# Patient Record
Sex: Female | Born: 1970 | Race: White | Hispanic: No | Marital: Married | Smoking: Never smoker
Health system: Southern US, Community
[De-identification: ages and names within clinical notes are randomized; demographics above are authoritative.]

## PROBLEM LIST (undated history)

## (undated) DIAGNOSIS — E785 Hyperlipidemia, unspecified: Secondary | ICD-10-CM

## (undated) DIAGNOSIS — R011 Cardiac murmur, unspecified: Secondary | ICD-10-CM

## (undated) DIAGNOSIS — J45909 Unspecified asthma, uncomplicated: Secondary | ICD-10-CM

## (undated) DIAGNOSIS — D72829 Elevated white blood cell count, unspecified: Secondary | ICD-10-CM

## (undated) DIAGNOSIS — R32 Unspecified urinary incontinence: Secondary | ICD-10-CM

## (undated) DIAGNOSIS — N3281 Overactive bladder: Secondary | ICD-10-CM

## (undated) DIAGNOSIS — R7989 Other specified abnormal findings of blood chemistry: Secondary | ICD-10-CM

## (undated) HISTORY — DX: Other specified abnormal findings of blood chemistry: R79.89

## (undated) HISTORY — DX: Overactive bladder: N32.81

## (undated) HISTORY — DX: Unspecified urinary incontinence: R32

## (undated) HISTORY — DX: Cardiac murmur, unspecified: R01.1

## (undated) HISTORY — DX: Elevated white blood cell count, unspecified: D72.829

## (undated) HISTORY — DX: Unspecified asthma, uncomplicated: J45.909

## (undated) HISTORY — DX: Hyperlipidemia, unspecified: E78.5

---

## 2002-03-28 ENCOUNTER — Ambulatory Visit (HOSPITAL_COMMUNITY): Admission: RE | Admit: 2002-03-28 | Discharge: 2002-03-28 | Payer: Self-pay | Admitting: Family Medicine

## 2002-03-28 ENCOUNTER — Encounter: Payer: Self-pay | Admitting: Family Medicine

## 2003-06-16 HISTORY — PX: CHOLECYSTECTOMY: SHX55

## 2004-07-16 ENCOUNTER — Ambulatory Visit (HOSPITAL_COMMUNITY): Admission: RE | Admit: 2004-07-16 | Discharge: 2004-07-16 | Payer: Self-pay | Admitting: Family Medicine

## 2005-03-30 ENCOUNTER — Ambulatory Visit (HOSPITAL_COMMUNITY): Admission: RE | Admit: 2005-03-30 | Discharge: 2005-03-30 | Payer: Self-pay | Admitting: Family Medicine

## 2005-04-13 ENCOUNTER — Ambulatory Visit (HOSPITAL_COMMUNITY): Admission: RE | Admit: 2005-04-13 | Discharge: 2005-04-13 | Payer: Self-pay | Admitting: General Surgery

## 2005-04-13 ENCOUNTER — Encounter (INDEPENDENT_AMBULATORY_CARE_PROVIDER_SITE_OTHER): Payer: Self-pay | Admitting: General Surgery

## 2006-03-24 ENCOUNTER — Ambulatory Visit: Payer: Self-pay | Admitting: Gastroenterology

## 2006-03-31 ENCOUNTER — Ambulatory Visit: Payer: Self-pay | Admitting: Internal Medicine

## 2006-04-12 ENCOUNTER — Ambulatory Visit: Payer: Self-pay | Admitting: Gastroenterology

## 2006-04-12 ENCOUNTER — Encounter (INDEPENDENT_AMBULATORY_CARE_PROVIDER_SITE_OTHER): Payer: Self-pay | Admitting: *Deleted

## 2006-04-12 ENCOUNTER — Ambulatory Visit (HOSPITAL_COMMUNITY): Admission: RE | Admit: 2006-04-12 | Discharge: 2006-04-12 | Payer: Self-pay | Admitting: Gastroenterology

## 2006-05-10 ENCOUNTER — Ambulatory Visit: Payer: Self-pay | Admitting: Gastroenterology

## 2009-11-08 ENCOUNTER — Ambulatory Visit (HOSPITAL_COMMUNITY): Admission: RE | Admit: 2009-11-08 | Discharge: 2009-11-08 | Payer: Self-pay | Admitting: Internal Medicine

## 2009-11-18 ENCOUNTER — Ambulatory Visit (HOSPITAL_COMMUNITY): Admission: RE | Admit: 2009-11-18 | Discharge: 2009-11-18 | Payer: Self-pay | Admitting: Internal Medicine

## 2010-06-09 ENCOUNTER — Inpatient Hospital Stay (HOSPITAL_COMMUNITY)
Admission: AD | Admit: 2010-06-09 | Discharge: 2010-06-09 | Payer: Self-pay | Source: Home / Self Care | Attending: Obstetrics and Gynecology | Admitting: Obstetrics and Gynecology

## 2010-06-10 ENCOUNTER — Encounter (INDEPENDENT_AMBULATORY_CARE_PROVIDER_SITE_OTHER): Payer: Self-pay | Admitting: Obstetrics and Gynecology

## 2010-06-10 ENCOUNTER — Inpatient Hospital Stay (HOSPITAL_COMMUNITY)
Admission: RE | Admit: 2010-06-10 | Discharge: 2010-06-13 | Payer: Self-pay | Source: Home / Self Care | Attending: Obstetrics and Gynecology | Admitting: Obstetrics and Gynecology

## 2010-08-25 LAB — CBC
Hemoglobin: 12.1 g/dL (ref 12.0–15.0)
Hemoglobin: 8.7 g/dL — ABNORMAL LOW (ref 12.0–15.0)
MCH: 28.4 pg (ref 26.0–34.0)
MCV: 85.3 fL (ref 78.0–100.0)
MCV: 85.9 fL (ref 78.0–100.0)
Platelets: 206 10*3/uL (ref 150–400)
RDW: 17.3 % — ABNORMAL HIGH (ref 11.5–15.5)
WBC: 10.2 10*3/uL (ref 4.0–10.5)
WBC: 11.7 10*3/uL — ABNORMAL HIGH (ref 4.0–10.5)

## 2010-08-25 LAB — SURGICAL PCR SCREEN: MRSA, PCR: NEGATIVE

## 2010-08-25 LAB — URINALYSIS, ROUTINE W REFLEX MICROSCOPIC
Bilirubin Urine: NEGATIVE
Glucose, UA: NEGATIVE mg/dL
Nitrite: NEGATIVE
Urobilinogen, UA: 1 mg/dL (ref 0.0–1.0)
pH: 6 (ref 5.0–8.0)

## 2010-08-25 LAB — URINE MICROSCOPIC-ADD ON

## 2010-10-31 NOTE — H&P (Signed)
Julia Mitchell, Julia Mitchell                ACCOUNT NO.:  [id number]   MEDICAL RECORD NO.:  [id number]          PATIENT TYPE:  AMB   LOCATION:                                FACILITY:  [facility]   PHYSICIAN:  Dalia Heading, M.D.  DATE OF BIRTH:  09/03/1970   DATE OF ADMISSION:  04/13/2005  DATE OF DISCHARGE:  LH                                HISTORY & PHYSICAL   CHIEF COMPLAINT:  Chronic cholecystitis.   HISTORY OF PRESENT ILLNESS:  The patient is a 40 year old, white female who  is referred for evaluation and treatment of biliary colic secondary to  chronic cholecystitis.  She has been having right upper quadrant abdominal  pain with radiation to the right flank, nausea and bloating for several  weeks.  No fever, chills or jaundice have been noted.   PAST MEDICAL HISTORY:  Extrinsic allergies.   PAST SURGICAL HISTORY:  1.  Wisdom teeth.  2.  C-section.   CURRENT MEDICATIONS:  Allegra 1 tablet daily.   ALLERGIES:  PENICILLIN.   REVIEW OF SYSTEMS:  Noncontributory.   PHYSICAL EXAMINATION:  GENERAL:  The patient is a well-developed, well-  nourished, white female in no acute distress.  HEENT:  No scleral icterus.  LUNGS:  Clear to auscultation with equal breath sounds bilaterally.  HEART:  Regular rate and rhythm without S3, S4 and murmurs.  ABDOMEN:  Soft and nondistended.  She is tender in the right upper quadrant  to palpation.  No hepatosplenomegaly, masses or hernias are identified.   LABORATORY DATA AND X-RAY FINDINGS:  Ultrasound of the gallbladder reveals a  gallbladder polyp.   IMPRESSION:  Chronic cholecystitis.   PLAN:  The patient is scheduled for a laparoscopic cholecystectomy on  April 13, 2005.  The risks and benefits of the procedure including  bleeding, infection, hepatobiliary injury and the possibility of an open  procedure were fully explained to the patient gaining informed consent.      Dalia Heading, M.D.  Electronically Signed     MAJ/MEDQ  D:   04/09/2005  T:  04/09/2005  Job:  [id number]   cc:   Corrie Mckusick, M.D.  Fax: [phone]

## 2010-10-31 NOTE — Op Note (Signed)
Julia Mitchell, PREHN                ACCOUNT NO.:  [id number]   MEDICAL RECORD NO.:  [id number]          PATIENT TYPE:  AMB   LOCATION:  DAY                           FACILITY:  [facility]   PHYSICIAN:  Dalia Heading, M.D.  DATE OF BIRTH:  02-13-1971   DATE OF PROCEDURE:  04/13/2005  DATE OF DISCHARGE:                                 OPERATIVE REPORT   PREOPERATIVE DIAGNOSIS:  Chronic cholecystitis.   POSTOPERATIVE DIAGNOSIS:  Chronic cholecystitis.   PROCEDURE:  Laparoscopic cholecystectomy.   SURGEON:  Dr. Franky Macho.   ANESTHESIA:  General endotracheal.   INDICATIONS:  The patient is a 40 year old white female who is referred for  biliary colic secondary to chronic cholecystitis. Risks and benefits of the  procedure including bleeding, infection, hepatobiliary injury, and the  possibility of an open procedure were fully explained to the patient, who  gave informed consent.   PROCEDURE NOTE:  The patient was placed in supine position. After induction  of general endotracheal anesthesia, the abdomen was prepped and draped in  the usual sterile technique with Betadine. Surgical site confirmation was  performed.   An infraumbilical incision was made down to the fascia. Veress needle was  introduced into the abdominal cavity and confirmation of placement was done  using the saline drop test. The abdomen was then insufflated to 16 mmHg  pressure. An 11-mm trocar was introduced into the abdominal cavity under  direct visualization without difficulty. The patient was placed in reversed  Trendelenburg position. Additional 11-mm trocar was placed in the epigastric  region. 5-mm trocars were placed in the right upper quadrant and right flank  regions. Liver was inspected, noted to be within normal limits. The  gallbladder was retracted superior and laterally. The dissection was begun  around the infundibulum of the gallbladder. The cystic duct was first  identified its juncture to the  infundibulum fully identified. Endoclips were  placed proximally distally and the cystic duct and cystic duct was divided.  This was likewise done with the cystic artery. The gallbladder then freed  away from the gallbladder fossa using Bovie electrocautery. The gallbladder  delivered through the epigastric trocar site using EndoCatch bag. The  gallbladder bed was inspected, noted to within normal limits. All fluid and  air were then evacuated from the abdominal cavity prior to removal of the  trocars.   All wounds were irrigated with normal saline. All wounds were injected with  0.5 cm Sensorcaine. The infraumbilical fascia was reapproximated using a 0  Vicryl interrupted suture. All skin incisions were closed using staples.  Betadine ointment and dry sterile dressings were applied.   All tape and needle counts correct at the end of the procedure. The patient  was extubated in the operating room and went back to recovery room awake in  stable condition.   COMPLICATIONS:  None.   SPECIMEN:  Gallbladder.   BLOOD LOSS:  Minimal.      Dalia Heading, M.D.  Electronically Signed     MAJ/MEDQ  D:  04/13/2005  T:  04/13/2005  Job:  [id number]  cc:   Halford Chessman, M.D.  Fax: [phone]

## 2010-10-31 NOTE — Op Note (Signed)
[name], [name]                ACCOUNT NO.:  [id number]   MEDICAL RECORD NO.:  [id number]          PATIENT TYPE:  AMB   LOCATION:  DAY                           FACILITY:  [facility]   PHYSICIAN:  Kassie Mends, M.D.      DATE OF BIRTH:  07-Aug-1970   DATE OF PROCEDURE:  04/12/2006  DATE OF DISCHARGE:                                 OPERATIVE REPORT   PROCEDURE:  Esophagogastroduodenoscopy with cold forceps biopsy.   INDICATION FOR EXAM:  Julia Mitchell is a 40 year old female who presents with  persistent epigastric pain without weight loss.  She denies nausea, vomiting  or difficulty swallowing.   FINDINGS:  1. Diffuse antral erythema.  Biopsies obtained to evaluate for H pylori      infection or eosinophilic esophagitis.  2. Normal esophagus without evidence of Barrett's.  3. Normal duodenal bulb and second portion of the duodenum.   RECOMMENDATIONS:  1. Continue Aciphex once daily.  2. Will call with biopsy results.  3. Follow up in 3-4 weeks with Julia Mitchell.   MEDICATIONS:  1. Demerol 75 mg IV.  2. Versed 4 mg IV.   PROCEDURE TECHNIQUE:  Physical exam was performed.  Informed consent was  obtained from the patient after explaining the benefits, risks and  alternatives to the procedure.  The patient was connected to the monitor and  placed in left lateral position.  Continuous oxygen was provided by nasal  cannula and IV medicine administered through an indwelling cannula.  After  administration of sedation, the patient's esophagus was intubated and the  scope was passed under direct visualization to the second portion of the  duodenum.  The scope was subsequently removed slowly by carefully examining  color, texture, anatomy and integrity of mucosa on the way out.  The patient  was recovered in endoscopy suite and discharged to home in satisfactory  condition.      Kassie Mends, M.D.  Electronically Signed     SM/MEDQ  D:  04/13/2006  T:  04/14/2006  Job:  [id number]

## 2014-05-24 ENCOUNTER — Ambulatory Visit (INDEPENDENT_AMBULATORY_CARE_PROVIDER_SITE_OTHER): Payer: BC Managed Care – PPO | Admitting: Otolaryngology

## 2014-05-24 DIAGNOSIS — J0101 Acute recurrent maxillary sinusitis: Secondary | ICD-10-CM

## 2014-05-24 DIAGNOSIS — J310 Chronic rhinitis: Secondary | ICD-10-CM

## 2014-05-24 DIAGNOSIS — J343 Hypertrophy of nasal turbinates: Secondary | ICD-10-CM

## 2014-06-14 ENCOUNTER — Ambulatory Visit (INDEPENDENT_AMBULATORY_CARE_PROVIDER_SITE_OTHER): Payer: BC Managed Care – PPO | Admitting: Otolaryngology

## 2014-06-14 DIAGNOSIS — J342 Deviated nasal septum: Secondary | ICD-10-CM

## 2014-06-14 DIAGNOSIS — J0101 Acute recurrent maxillary sinusitis: Secondary | ICD-10-CM

## 2014-06-14 DIAGNOSIS — J343 Hypertrophy of nasal turbinates: Secondary | ICD-10-CM

## 2014-06-18 ENCOUNTER — Other Ambulatory Visit (INDEPENDENT_AMBULATORY_CARE_PROVIDER_SITE_OTHER): Payer: Self-pay | Admitting: Otolaryngology

## 2014-06-18 DIAGNOSIS — J321 Chronic frontal sinusitis: Secondary | ICD-10-CM

## 2014-06-20 ENCOUNTER — Ambulatory Visit (HOSPITAL_COMMUNITY)
Admission: RE | Admit: 2014-06-20 | Discharge: 2014-06-20 | Disposition: A | Discharge status: Home / Self Care | Payer: BLUE CROSS/BLUE SHIELD | Source: Ambulatory Visit | Attending: Otolaryngology | Admitting: Otolaryngology

## 2014-06-20 DIAGNOSIS — R51 Headache: Secondary | ICD-10-CM | POA: Diagnosis not present

## 2014-06-20 DIAGNOSIS — J321 Chronic frontal sinusitis: Secondary | ICD-10-CM | POA: Insufficient documentation

## 2014-06-28 ENCOUNTER — Ambulatory Visit (INDEPENDENT_AMBULATORY_CARE_PROVIDER_SITE_OTHER): Payer: BLUE CROSS/BLUE SHIELD | Admitting: Otolaryngology

## 2014-06-28 DIAGNOSIS — J310 Chronic rhinitis: Secondary | ICD-10-CM

## 2014-06-28 DIAGNOSIS — J322 Chronic ethmoidal sinusitis: Secondary | ICD-10-CM

## 2014-07-12 ENCOUNTER — Ambulatory Visit (INDEPENDENT_AMBULATORY_CARE_PROVIDER_SITE_OTHER): Payer: BLUE CROSS/BLUE SHIELD | Admitting: Otolaryngology

## 2014-07-12 DIAGNOSIS — J310 Chronic rhinitis: Secondary | ICD-10-CM

## 2014-11-02 ENCOUNTER — Encounter: Payer: Self-pay | Admitting: Obstetrics and Gynecology

## 2014-11-02 ENCOUNTER — Other Ambulatory Visit: Payer: Self-pay

## 2014-11-02 ENCOUNTER — Ambulatory Visit (INDEPENDENT_AMBULATORY_CARE_PROVIDER_SITE_OTHER): Payer: BLUE CROSS/BLUE SHIELD | Admitting: Obstetrics and Gynecology

## 2014-11-02 VITALS — BP 130/84 | HR 80 | Resp 18 | Ht 64.5 in | Wt 210.8 lb

## 2014-11-02 DIAGNOSIS — Z1231 Encounter for screening mammogram for malignant neoplasm of breast: Secondary | ICD-10-CM

## 2014-11-02 DIAGNOSIS — R32 Unspecified urinary incontinence: Secondary | ICD-10-CM | POA: Diagnosis not present

## 2014-11-02 DIAGNOSIS — Z0000 Encounter for general adult medical examination without abnormal findings: Secondary | ICD-10-CM | POA: Diagnosis not present

## 2014-11-02 DIAGNOSIS — Z01419 Encounter for gynecological examination (general) (routine) without abnormal findings: Secondary | ICD-10-CM

## 2014-11-02 LAB — POCT URINALYSIS DIPSTICK
BILIRUBIN UA: NEGATIVE
Blood, UA: NEGATIVE
Glucose, UA: NEGATIVE
KETONES UA: NEGATIVE
LEUKOCYTES UA: NEGATIVE
Nitrite, UA: NEGATIVE
PH UA: 5
Protein, UA: NEGATIVE
Urobilinogen, UA: NEGATIVE

## 2014-11-02 MED ORDER — NYSTATIN 100000 UNIT/GM EX POWD
Freq: Three times a day (TID) | CUTANEOUS | Status: DC
Start: 2014-11-02 — End: 2018-12-28

## 2014-11-02 NOTE — Patient Instructions (Signed)

## 2014-11-02 NOTE — Progress Notes (Signed)
Patient ID: Julia Mitchell, female   DOB: 1971/06/07, [age] y.o.   MRN: [id number] 44 y.o. 613P4 Married Caucasian female here for annual exam.    Menses every 4 - 8 weeks.  Can be heavy or light.  No prolonged bleeding episodes.  Some leakage with cough or sneeze.   Has poison ivy currently.   PCP:   Assunta FoundJohn Golding, MD  Patient's last menstrual period was 10/07/2014.          Sexually active: Yes.   female The current method of family planning is tubal ligation.    Exercising: No.  none. Smoker:  no  Health Maintenance: Pap:  2012 normal History of abnormal Pap:  no MMG:  NEVER Colonoscopy:  N/A BMD:   N/A  Result  N/A TDaP:  PCP Screening Labs:  Hb today: PCP, Urine today: Neg   reports that she has never smoked. She does not have any smokeless tobacco history on file. She reports that she does not drink alcohol or use illicit drugs.  Past Medical History  Diagnosis Date  . Heart murmur     functional--since childhood  . Urinary incontinence     occ. with coughing, sneezing    Past Surgical History  Procedure Laterality Date  . Cholecystectomy  2005  . Cesarean section      2002, 2011    Current Outpatient Prescriptions  Medication Sig Dispense Refill  . fluticasone (FLONASE) 50 MCG/ACT nasal spray Place 1 spray into both nostrils at bedtime.  98  . hydrocortisone cream 1 % Apply 1 application topically as needed for itching.    . loratadine (CLARITIN) 10 MG tablet Take 10 mg by mouth daily.    Marland Kitchen. triamcinolone cream (KENALOG) 0.1 % Apply 1 application topically 2 (two) times daily.  0   No current facility-administered medications for this visit.    Family History  Problem Relation Age of Onset  . COPD Father     Dec age 46  . Hypertension Mother   . Thyroid disease Mother   . Stroke Paternal Aunt   . Rheum arthritis Maternal Grandmother   . Asthma Maternal Grandfather   . Hypertension Maternal Grandfather     ROS:  Pertinent items are noted in HPI.   Otherwise, a comprehensive ROS was negative.  Exam:   BP 130/84 mmHg  Pulse 80  Resp 18  Ht 5' 4.5" (1.638 m)  Wt 210 lb 12.8 oz (95.618 kg)  BMI 35.64 kg/m2  LMP 10/07/2014    General appearance: alert, cooperative and appears stated age Head: Normocephalic, without obvious abnormality, atraumatic Neck: no adenopathy, supple, symmetrical, trachea midline and thyroid normal to inspection and palpation Lungs: clear to auscultation bilaterally Breasts: normal appearance, no masses or tenderness, Inspection negative, No nipple retraction or dimpling, No nipple discharge or bleeding, No axillary or supraclavicular adenopathy Heart: regular rate and rhythm Abdomen: Pfannensteil incision, soft, non-tender; bowel sounds normal; no masses,  no organomegaly Extremities: extremities normal, atraumatic, no cyanosis or edema Skin: Skin color, texture, turgor normal. No rashes or lesions Lymph nodes: Cervical, supraclavicular, and axillary nodes normal. No abnormal inguinal nodes palpated Neurologic: Grossly normal  Pelvic: External genitalia:  no lesions              Urethra:  normal appearing urethra with no masses, tenderness or lesions              Bartholins and Skenes: normal  Vagina: normal appearing vagina with normal color and discharge, no lesions              Cervix: no lesions              Pap taken: Yes.   Bimanual Exam:  Uterus:  normal size, contour, position, consistency, mobility, non-tender              Adnexa: normal adnexa and no mass, fullness, tenderness              Rectovaginal: Yes.  .  Confirms.              Anus:  normal sphincter tone, no lesions  Chaperone was present for exam.  Assessment:   Well woman visit with normal exam. Status post bilateral tubal ligation.  Mild genuine stress incontinence.  Plan: Yearly mammogram recommended after age 46.  [facility]  Recommended self breast exam.  Pap and HR HPV as  above. Discussed Calcium, Vitamin D, regular exercise program including cardiovascular and weight bearing exercise. Kegel's reviewed.  Labs performed.  No..    Refills given on medications.  No..    Follow up annually and prn.      After visit summary provided.

## 2014-11-06 LAB — IPS PAP TEST WITH HPV

## 2014-11-29 ENCOUNTER — Ambulatory Visit
Admission: RE | Admit: 2014-11-29 | Discharge: 2014-11-29 | Disposition: A | Discharge status: Home / Self Care | Payer: BLUE CROSS/BLUE SHIELD | Source: Ambulatory Visit

## 2014-11-29 DIAGNOSIS — Z1231 Encounter for screening mammogram for malignant neoplasm of breast: Secondary | ICD-10-CM

## 2015-05-29 ENCOUNTER — Ambulatory Visit (HOSPITAL_COMMUNITY)
Admission: RE | Admit: 2015-05-29 | Discharge: 2015-05-29 | Disposition: A | Discharge status: Home / Self Care | Payer: BLUE CROSS/BLUE SHIELD | Source: Ambulatory Visit | Attending: Family Medicine | Admitting: Family Medicine

## 2015-05-29 ENCOUNTER — Other Ambulatory Visit (HOSPITAL_COMMUNITY): Payer: Self-pay | Admitting: Family Medicine

## 2015-05-29 DIAGNOSIS — R05 Cough: Secondary | ICD-10-CM

## 2015-05-29 DIAGNOSIS — R0602 Shortness of breath: Secondary | ICD-10-CM | POA: Insufficient documentation

## 2015-05-29 DIAGNOSIS — R059 Cough, unspecified: Secondary | ICD-10-CM

## 2015-11-07 ENCOUNTER — Ambulatory Visit (INDEPENDENT_AMBULATORY_CARE_PROVIDER_SITE_OTHER): Payer: BLUE CROSS/BLUE SHIELD | Admitting: Obstetrics and Gynecology

## 2015-11-07 ENCOUNTER — Encounter: Payer: Self-pay | Admitting: Obstetrics and Gynecology

## 2015-11-07 VITALS — BP 140/82 | HR 70 | Resp 16 | Ht 64.25 in | Wt 203.8 lb

## 2015-11-07 DIAGNOSIS — Z0000 Encounter for general adult medical examination without abnormal findings: Secondary | ICD-10-CM

## 2015-11-07 DIAGNOSIS — Z01419 Encounter for gynecological examination (general) (routine) without abnormal findings: Secondary | ICD-10-CM

## 2015-11-07 LAB — POCT URINALYSIS DIPSTICK
BILIRUBIN UA: NEGATIVE
Blood, UA: NEGATIVE
Glucose, UA: NEGATIVE
KETONES UA: NEGATIVE
LEUKOCYTES UA: NEGATIVE
NITRITE UA: NEGATIVE
PH UA: 5
Protein, UA: NEGATIVE
Urobilinogen, UA: NEGATIVE

## 2015-11-07 NOTE — Patient Instructions (Signed)
Health Maintenance, Female Adopting a healthy lifestyle and getting preventive care can go a long way to promote health and wellness. Talk with your health care provider about what schedule of regular examinations is right for you. This is a good chance for you to check in with your provider about disease prevention and staying healthy. In between checkups, there are plenty of things you can do on your own. Experts have done a lot of research about which lifestyle changes and preventive measures are most likely to keep you healthy. Ask your health care provider for more information. WEIGHT AND DIET  Eat a healthy diet  Be sure to include plenty of vegetables, fruits, low-fat dairy products, and lean protein.  Do not eat a lot of foods high in solid fats, added sugars, or salt.  Get regular exercise. This is one of the most important things you can do for your health.  Most adults should exercise for at least 150 minutes each week. The exercise should increase your heart rate and make you sweat (moderate-intensity exercise).  Most adults should also do strengthening exercises at least twice a week. This is in addition to the moderate-intensity exercise.  Maintain a healthy weight  Body mass index (BMI) is a measurement that can be used to identify possible weight problems. It estimates body fat based on height and weight. Your health care provider can help determine your BMI and help you achieve or maintain a healthy weight.  For females 20 years of age and older:   A BMI below 18.5 is considered underweight.  A BMI of 18.5 to 24.9 is normal.  A BMI of 25 to 29.9 is considered overweight.  A BMI of 30 and above is considered obese.  Watch levels of cholesterol and blood lipids  You should start having your blood tested for lipids and cholesterol at 47 years of age, then have this test every 5 years.  You may need to have your cholesterol levels checked more often if:  Your lipid  or cholesterol levels are high.  You are older than 47 years of age.  You are at high risk for heart disease.  CANCER SCREENING   Lung Cancer  Lung cancer screening is recommended for adults 55-80 years old who are at high risk for lung cancer because of a history of smoking.  A yearly low-dose CT scan of the lungs is recommended for people who:  Currently smoke.  Have quit within the past 15 years.  Have at least a 30-pack-year history of smoking. A pack year is smoking an average of one pack of cigarettes a day for 1 year.  Yearly screening should continue until it has been 15 years since you quit.  Yearly screening should stop if you develop a health problem that would prevent you from having lung cancer treatment.  Breast Cancer  Practice breast self-awareness. This means understanding how your breasts normally appear and feel.  It also means doing regular breast self-exams. Let your health care provider know about any changes, no matter how small.  If you are in your 20s or 30s, you should have a clinical breast exam (CBE) by a health care provider every 1-3 years as part of a regular health exam.  If you are 40 or older, have a CBE every year. Also consider having a breast X-ray (mammogram) every year.  If you have a family history of breast cancer, talk to your health care provider about genetic screening.  If you   are at high risk for breast cancer, talk to your health care provider about having an MRI and a mammogram every year.  Breast cancer gene (BRCA) assessment is recommended for women who have family members with BRCA-related cancers. BRCA-related cancers include:  Breast.  Ovarian.  Tubal.  Peritoneal cancers.  Results of the assessment will determine the need for genetic counseling and BRCA1 and BRCA2 testing. Cervical Cancer Your health care provider may recommend that you be screened regularly for cancer of the pelvic organs (ovaries, uterus, and  vagina). This screening involves a pelvic examination, including checking for microscopic changes to the surface of your cervix (Pap test). You may be encouraged to have this screening done every 3 years, beginning at age 21.  For women ages 30-65, health care providers may recommend pelvic exams and Pap testing every 3 years, or they may recommend the Pap and pelvic exam, combined with testing for human papilloma virus (HPV), every 5 years. Some types of HPV increase your risk of cervical cancer. Testing for HPV may also be done on women of any age with unclear Pap test results.  Other health care providers may not recommend any screening for nonpregnant women who are considered low risk for pelvic cancer and who do not have symptoms. Ask your health care provider if a screening pelvic exam is right for you.  If you have had past treatment for cervical cancer or a condition that could lead to cancer, you need Pap tests and screening for cancer for at least 20 years after your treatment. If Pap tests have been discontinued, your risk factors (such as having a new sexual partner) need to be reassessed to determine if screening should resume. Some women have medical problems that increase the chance of getting cervical cancer. In these cases, your health care provider may recommend more frequent screening and Pap tests. Colorectal Cancer  This type of cancer can be detected and often prevented.  Routine colorectal cancer screening usually begins at 47 years of age and continues through 47 years of age.  Your health care provider may recommend screening at an earlier age if you have risk factors for colon cancer.  Your health care provider may also recommend using home test kits to check for hidden blood in the stool.  A small camera at the end of a tube can be used to examine your colon directly (sigmoidoscopy or colonoscopy). This is done to check for the earliest forms of colorectal  cancer.  Routine screening usually begins at age 50.  Direct examination of the colon should be repeated every 5-10 years through 47 years of age. However, you may need to be screened more often if early forms of precancerous polyps or small growths are found. Skin Cancer  Check your skin from head to toe regularly.  Tell your health care provider about any new moles or changes in moles, especially if there is a change in a mole's shape or color.  Also tell your health care provider if you have a mole that is larger than the size of a pencil eraser.  Always use sunscreen. Apply sunscreen liberally and repeatedly throughout the day.  Protect yourself by wearing long sleeves, pants, a wide-brimmed hat, and sunglasses whenever you are outside. HEART DISEASE, DIABETES, AND HIGH BLOOD PRESSURE   High blood pressure causes heart disease and increases the risk of stroke. High blood pressure is more likely to develop in:  People who have blood pressure in the high end   of the normal range (130-139/85-89 mm Hg).  People who are overweight or obese.  People who are African American.  If you are 38-23 years of age, have your blood pressure checked every 3-5 years. If you are 61 years of age or older, have your blood pressure checked every year. You should have your blood pressure measured twice--once when you are at a hospital or clinic, and once when you are not at a hospital or clinic. Record the average of the two measurements. To check your blood pressure when you are not at a hospital or clinic, you can use:  An automated blood pressure machine at a pharmacy.  A home blood pressure monitor.  If you are between 45 years and 39 years old, ask your health care provider if you should take aspirin to prevent strokes.  Have regular diabetes screenings. This involves taking a blood sample to check your fasting blood sugar level.  If you are at a normal weight and have a low risk for diabetes,  have this test once every three years after 47 years of age.  If you are overweight and have a high risk for diabetes, consider being tested at a younger age or more often. PREVENTING INFECTION  Hepatitis B  If you have a higher risk for hepatitis B, you should be screened for this virus. You are considered at high risk for hepatitis B if:  You were born in a country where hepatitis B is common. Ask your health care provider which countries are considered high risk.  Your parents were born in a high-risk country, and you have not been immunized against hepatitis B (hepatitis B vaccine).  You have HIV or AIDS.  You use needles to inject street drugs.  You live with someone who has hepatitis B.  You have had sex with someone who has hepatitis B.  You get hemodialysis treatment.  You take certain medicines for conditions, including cancer, organ transplantation, and autoimmune conditions. Hepatitis C  Blood testing is recommended for:  Everyone born from [birth year] through 1967.  Anyone with known risk factors for hepatitis C. Sexually transmitted infections (STIs)  You should be screened for sexually transmitted infections (STIs) including gonorrhea and chlamydia if:  You are sexually active and are younger than 47 years of age.  You are older than 47 years of age and your health care provider tells you that you are at risk for this type of infection.  Your sexual activity has changed since you were last screened and you are at an increased risk for chlamydia or gonorrhea. Ask your health care provider if you are at risk.  If you do not have HIV, but are at risk, it may be recommended that you take a prescription medicine daily to prevent HIV infection. This is called pre-exposure prophylaxis (PrEP). You are considered at risk if:  You are sexually active and do not regularly use condoms or know the HIV status of your partner(s).  You take drugs by injection.  You are sexually  active with a partner who has HIV. Talk with your health care provider about whether you are at high risk of being infected with HIV. If you choose to begin PrEP, you should first be tested for HIV. You should then be tested every 3 months for as long as you are taking PrEP.  PREGNANCY   If you are premenopausal and you may become pregnant, ask your health care provider about preconception counseling.  If you may  become pregnant, take 400 to 800 micrograms (mcg) of folic acid every day.  If you want to prevent pregnancy, talk to your health care provider about birth control (contraception). OSTEOPOROSIS AND MENOPAUSE   Osteoporosis is a disease in which the bones lose minerals and strength with aging. This can result in serious bone fractures. Your risk for osteoporosis can be identified using a bone density scan.  If you are 61 years of age or older, or if you are at risk for osteoporosis and fractures, ask your health care provider if you should be screened.  Ask your health care provider whether you should take a calcium or vitamin D supplement to lower your risk for osteoporosis.  Menopause may have certain physical symptoms and risks.  Hormone replacement therapy may reduce some of these symptoms and risks. Talk to your health care provider about whether hormone replacement therapy is right for you.  HOME CARE INSTRUCTIONS   Schedule regular health, dental, and eye exams.  Stay current with your immunizations.   Do not use any tobacco products including cigarettes, chewing tobacco, or electronic cigarettes.  If you are pregnant, do not drink alcohol.  If you are breastfeeding, limit how much and how often you drink alcohol.  Limit alcohol intake to no more than 1 drink per day for nonpregnant women. One drink equals 12 ounces of beer, 5 ounces of wine, or 1 ounces of hard liquor.  Do not use street drugs.  Do not share needles.  Ask your health care provider for help if  you need support or information about quitting drugs.  Tell your health care provider if you often feel depressed.  Tell your health care provider if you have ever been abused or do not feel safe at home.   This information is not intended to replace advice given to you by your health care provider. Make sure you discuss any questions you have with your health care provider.   Document Released: 12/15/2010 Document Revised: 06/22/2014 Document Reviewed: 05/03/2013 Elsevier Interactive Patient Education [employer].

## 2015-11-07 NOTE — Progress Notes (Signed)
Patient ID: Julia Mitchell, female   DOB: May 01, 1971, 45 y.o.   MRN: [id number] 45 y.o. G30P2104 Married Caucasian female here for annual exam.    Menses every other month. Some night swets. Managing ok.  New dx of asthma.   Taking Vesicare for urinary frequency.  Saw Dr. Nechama Guard, urology, in [place].  Little bit of leak with coughing.   Son graduating this June and going off to college.  PCP:  Assunta Found, MD   Patient's last menstrual period was 10/16/2015 (exact date).           Sexually active: Yes.   female The current method of family planning is tubal ligation.    Exercising: No.   Smoker:  no  Health Maintenance: Pap:  11-02-14 Neg:Neg HR HPV History of abnormal Pap:  no MMG:  11-29-14 3D/Density B/Neg/BiRads1:[facility]. Colonoscopy:  n/a BMD:   n/a  Result  n/a TDaP:  PCP Gardasil:   N/A Labs:  Hb today: PCP, Urine today: Neg   reports that she has never smoked. She does not have any smokeless tobacco history on file. She reports that she does not drink alcohol or use illicit drugs.  Past Medical History  Diagnosis Date  . Heart murmur     functional--since childhood  . Urinary incontinence     occ. with coughing, sneezing  . Asthma   . OAB (overactive bladder)     Past Surgical History  Procedure Laterality Date  . Cholecystectomy  2005  . Cesarean section      2002, 2011    Current Outpatient Prescriptions  Medication Sig Dispense Refill  . fluticasone (FLONASE) 50 MCG/ACT nasal spray Place 1 spray into both nostrils every morning.   98  . hydrocortisone cream 1 % Apply 1 application topically as needed for itching.    . loratadine (CLARITIN) 10 MG tablet Take 10 mg by mouth daily.    . montelukast (SINGULAIR) 10 MG tablet Take 1 tablet by mouth daily.  10  . nystatin (MYCOSTATIN) powder Apply topically 3 (three) times daily. Apply to affected area for up to 7 days 15 g 2  . solifenacin (VESICARE) 5 MG tablet Take 5 mg by mouth daily.    Marland Kitchen  triamcinolone cream (KENALOG) 0.1 % Apply 1 application topically 2 (two) times daily.  0   No current facility-administered medications for this visit.    Family History  Problem Relation Age of Onset  . COPD Father     Dec age 88  . Hypertension Mother   . Thyroid disease Mother   . Stroke Paternal Aunt   . Rheum arthritis Maternal Grandmother   . Asthma Maternal Grandfather   . Hypertension Maternal Grandfather     ROS:  Pertinent items are noted in HPI.  Otherwise, a comprehensive ROS was negative.  Exam:   BP 140/82 mmHg  Pulse 70  Resp 16  Ht 5' 4.25" (1.632 m)  Wt 203 lb 12.8 oz (92.443 kg)  BMI 34.71 kg/m2  LMP 10/16/2015 (Exact Date)    General appearance: alert, cooperative and appears stated age Head: Normocephalic, without obvious abnormality, atraumatic Neck: no adenopathy, supple, symmetrical, trachea midline and thyroid normal to inspection and palpation Lungs: clear to auscultation bilaterally Breasts: normal appearance, no masses or tenderness, Inspection negative, No nipple retraction or dimpling, No nipple discharge or bleeding, No axillary or supraclavicular adenopathy Heart: regular rate and rhythm Abdomen: incisions:  Yes.    Pfannenstiel , soft, non-tender; no masses,  no organomegaly Extremities: extremities normal, atraumatic, no cyanosis or edema Skin: Skin color, texture, turgor normal. No rashes or lesions Lymph nodes: Cervical, supraclavicular, and axillary nodes normal. No abnormal inguinal nodes palpated Neurologic: Grossly normal  Pelvic: External genitalia:  no lesions              Urethra:  normal appearing urethra with no masses, tenderness or lesions              Bartholins and Skenes: normal                 Vagina: normal appearing vagina with normal color and discharge, no lesions              Cervix: ectropion at 12:00?              Pap taken: Yes.   Bimanual Exam:  Uterus:  normal size, contour, position, consistency, mobility,  non-tender              Adnexa: no mass, fullness, tenderness              Rectal exam: Yes.  .  Confirms.              Anus:  normal sphincter tone, no lesions  Chaperone was present for exam.  Assessment:   Well woman visit with normal exam. Skipped menses. Overactive bladder.  Asthma.  Plan: Yearly mammogram recommended after age 45.  Patient will schedule. Recommended self breast exam.  Pap and HR HPV as above. Discussed Calcium, Vitamin D, regular exercise program including cardiovascular and weight bearing exercise.  Discussed developing an exercise routine.  Labs performed.  No..    Labs with PCP.  Prescription medication(s) given.  No..    Follow up annually and prn.      After visit summary provided.

## 2015-11-14 LAB — IPS PAP TEST WITH HPV

## 2015-11-15 ENCOUNTER — Encounter (HOSPITAL_COMMUNITY): Payer: Self-pay | Admitting: *Deleted

## 2015-11-15 ENCOUNTER — Emergency Department (HOSPITAL_COMMUNITY)
Admission: EM | Admit: 2015-11-15 | Discharge: 2015-11-15 | Disposition: A | Discharge status: Home / Self Care | Payer: BLUE CROSS/BLUE SHIELD | Attending: Emergency Medicine | Admitting: Emergency Medicine

## 2015-11-15 ENCOUNTER — Emergency Department (HOSPITAL_COMMUNITY): Payer: BLUE CROSS/BLUE SHIELD

## 2015-11-15 DIAGNOSIS — S5002XA Contusion of left elbow, initial encounter: Secondary | ICD-10-CM | POA: Diagnosis not present

## 2015-11-15 DIAGNOSIS — S59902A Unspecified injury of left elbow, initial encounter: Secondary | ICD-10-CM

## 2015-11-15 DIAGNOSIS — S99912A Unspecified injury of left ankle, initial encounter: Secondary | ICD-10-CM | POA: Diagnosis present

## 2015-11-15 DIAGNOSIS — Y9389 Activity, other specified: Secondary | ICD-10-CM | POA: Insufficient documentation

## 2015-11-15 DIAGNOSIS — Y999 Unspecified external cause status: Secondary | ICD-10-CM | POA: Diagnosis not present

## 2015-11-15 DIAGNOSIS — S8262XA Displaced fracture of lateral malleolus of left fibula, initial encounter for closed fracture: Secondary | ICD-10-CM | POA: Insufficient documentation

## 2015-11-15 DIAGNOSIS — J45909 Unspecified asthma, uncomplicated: Secondary | ICD-10-CM | POA: Diagnosis not present

## 2015-11-15 DIAGNOSIS — Y92410 Unspecified street and highway as the place of occurrence of the external cause: Secondary | ICD-10-CM | POA: Insufficient documentation

## 2015-11-15 MED ORDER — IBUPROFEN 600 MG PO TABS: 600.0000 mg | ORAL_TABLET | Freq: Four times a day (QID) | ORAL | Status: DC | PRN

## 2015-11-15 MED ORDER — HYDROCODONE-ACETAMINOPHEN 5-325 MG PO TABS: 1.0000 | ORAL_TABLET | ORAL | Status: DC | PRN

## 2015-11-15 NOTE — Discharge Instructions (Signed)
Your x-ray of the left elbow questions a small area of abnormality. Please have your primary physician recheck this, or return to the emergency department in 3-4 days to have this rechecked, to rule out fracture. The x-ray of your left ankle reveals avulsion fracture of your fibula. Please use the cam walker when up and about. You do not have to sleep in this device. Please see Dr. Romeo AppleHarrison for additional evaluation concerning this fracture. Please elevate your left leg above your waist as much as possible. Use tylenol or ibuprofen for mild pain. Use norco for more severe pain. Use this medication with caution. Ankle Fracture A fracture is a break in a bone. A cast or splint may be used to protect the ankle and heal the break. Sometimes, surgery is needed. HOME CARE  Use crutches as told by your doctor. It is very important that you use your crutches correctly.  Do not put weight or pressure on the injured ankle until told by your doctor.  Keep your ankle raised (elevated) when sitting or lying down.  Apply ice to the ankle:  Put ice in a plastic bag.  Place a towel between your cast and the bag.  Leave the ice on for 20 minutes, 2-3 times a day.  If you have a plaster or fiberglass cast:  Do not try to scratch under the cast with any objects.  Check the skin around the cast every day. You may put lotion on red or sore areas.  Keep your cast dry and clean.  If you have a plaster splint:  Wear the splint as told by your doctor.  You can loosen the elastic around the splint if your toes get numb, tingle, or turn cold or blue.  Do not put pressure on any part of your cast or splint. It may break. Rest your plaster splint or cast only on a pillow the first 24 hours until it is fully hardened.  Cover your cast or splint with a plastic bag during showers.  Do not lower your cast or splint into water.  Take medicine as told by your doctor.  Do not drive until your doctor says it is  safe.  Follow-up with your doctor as told. It is very important that you go to your follow-up visits. GET HELP IF: The swelling and discomfort gets worse.  GET HELP RIGHT AWAY IF:   Your splint or cast breaks.  You continue to have very bad pain.  You have new pain or swelling after your splint or cast was put on.  Your skin or toes below the injured ankle:  Turn blue or gray.  Feel cold, numb, or you cannot feel them.  There is a bad smell or yellowish white fluid (pus) coming from under the splint or cast. MAKE SURE YOU:   Understand these instructions.  Will watch your condition.  Will get help right away if you are not doing well or get worse.   This information is not intended to replace advice given to you by your health care provider. Make sure you discuss any questions you have with your health care provider.   Document Released: 03/29/2009 Document Revised: 03/22/2013 Document Reviewed: 12/29/2012 Elsevier Interactive Patient Education 2016 [employer]  Tourist information centre managerMotor Vehicle Collision It is common to have multiple bruises and sore muscles after a motor vehicle collision (MVC). These tend to feel worse for the first 24 hours. You may have the most stiffness and soreness over the first several hours.  You may also feel worse when you wake up the first morning after your collision. After this point, you will usually begin to improve with each day. The speed of improvement often depends on the severity of the collision, the number of injuries, and the location and nature of these injuries. HOME CARE INSTRUCTIONS  Put ice on the injured area.  Put ice in a plastic bag.  Place a towel between your skin and the bag.  Leave the ice on for 15-20 minutes, 3-4 times a day, or as directed by your health care provider.  Drink enough fluids to keep your urine clear or pale yellow. Do not drink alcohol.  Take a warm shower or bath once or twice a day. This will increase blood flow  to sore muscles.  You may return to activities as directed by your caregiver. Be careful when lifting, as this may aggravate neck or back pain.  Only take over-the-counter or prescription medicines for pain, discomfort, or fever as directed by your caregiver. Do not use aspirin. This may increase bruising and bleeding. SEEK IMMEDIATE MEDICAL CARE IF:  You have numbness, tingling, or weakness in the arms or legs.  You develop severe headaches not relieved with medicine.  You have severe neck pain, especially tenderness in the middle of the back of your neck.  You have changes in bowel or bladder control.  There is increasing pain in any area of the body.  You have shortness of breath, light-headedness, dizziness, or fainting.  You have chest pain.  You feel sick to your stomach (nauseous), throw up (vomit), or sweat.  You have increasing abdominal discomfort.  There is blood in your urine, stool, or vomit.  You have pain in your shoulder (shoulder strap areas).  You feel your symptoms are getting worse. MAKE SURE YOU:  Understand these instructions.  Will watch your condition.  Will get help right away if you are not doing well or get worse.   This information is not intended to replace advice given to you by your health care provider. Make sure you discuss any questions you have with your health care provider.   Document Released: 06/01/2005 Document Revised: 06/22/2014 Document Reviewed: 10/29/2010 Elsevier Interactive Patient Education [employer] s much as possible.
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
## 2015-11-20 ENCOUNTER — Encounter: Payer: Self-pay | Admitting: Orthopaedic Surgery

## 2015-11-20 ENCOUNTER — Ambulatory Visit (INDEPENDENT_AMBULATORY_CARE_PROVIDER_SITE_OTHER): Payer: BLUE CROSS/BLUE SHIELD | Admitting: Orthopaedic Surgery

## 2015-11-20 VITALS — BP 138/87 | HR 73 | Temp 98.1°F | Ht 64.0 in | Wt 206.4 lb

## 2015-11-20 DIAGNOSIS — S8262XA Displaced fracture of lateral malleolus of left fibula, initial encounter for closed fracture: Secondary | ICD-10-CM | POA: Diagnosis not present

## 2015-11-20 DIAGNOSIS — S5011XA Contusion of right forearm, initial encounter: Secondary | ICD-10-CM | POA: Diagnosis not present

## 2015-11-20 DIAGNOSIS — M25521 Pain in right elbow: Secondary | ICD-10-CM | POA: Diagnosis not present

## 2015-11-20 DIAGNOSIS — M25522 Pain in left elbow: Secondary | ICD-10-CM | POA: Diagnosis not present

## 2015-11-20 MED ORDER — IBUPROFEN 600 MG PO TABS
600.0000 mg | ORAL_TABLET | Freq: Four times a day (QID) | ORAL | Status: DC | PRN
Start: 2015-11-20 — End: 2017-06-22
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
## 2015-11-25 ENCOUNTER — Telehealth: Payer: Self-pay | Admitting: Orthopaedic Surgery

## 2015-11-25 NOTE — Telephone Encounter (Signed)
Patient called asking if you would do a return to work note for her.  She also asked for a note to cover [place] its ok for her to travel. She has left Ankle fx and was here on the 7th.

## 2015-11-25 NOTE — Telephone Encounter (Signed)
OK. Make it so 

## 2015-11-26 ENCOUNTER — Encounter: Payer: Self-pay | Admitting: Orthopaedic Surgery

## 2015-12-04 ENCOUNTER — Ambulatory Visit (INDEPENDENT_AMBULATORY_CARE_PROVIDER_SITE_OTHER): Payer: BLUE CROSS/BLUE SHIELD

## 2015-12-04 ENCOUNTER — Ambulatory Visit: Payer: BLUE CROSS/BLUE SHIELD | Admitting: Orthopaedic Surgery

## 2015-12-04 ENCOUNTER — Encounter: Payer: Self-pay | Admitting: Orthopaedic Surgery

## 2015-12-04 VITALS — BP 139/85 | HR 69 | Temp 97.7°F | Ht 64.75 in | Wt 213.4 lb

## 2015-12-04 DIAGNOSIS — S8262XD Displaced fracture of lateral malleolus of left fibula, subsequent encounter for closed fracture with routine healing: Secondary | ICD-10-CM

## 2015-12-04 DIAGNOSIS — M25521 Pain in right elbow: Secondary | ICD-10-CM

## 2015-12-04 DIAGNOSIS — M25522 Pain in left elbow: Secondary | ICD-10-CM

## 2015-12-04 NOTE — Progress Notes (Signed)
CC:  My ankle is better.  She has been using a CAM walker on the left.  She has much less pain.  She is walking well.  Her elbows are not hurting at all now.  The hematoma on the right has resolved.  She has full ROM of the elbows.  NV is intact to the left ankle.  She has slight tenderness of the lateral malleolus area.  Encounter Diagnoses  Name Primary?  . Fracture of left ankle, lateral malleolus, closed, with routine healing, subsequent encounter Yes  . Left elbow pain   . Right elbow pain    She can come out of the CAM walker as needed.  X-rays are reported separately.  I cannot see the avulsion fracture on the films today.  I told her this.  Call if any problem.  Precautions given.  Electronically Signed Darreld McleanWayne Takesha Steger, MD 6/21/20179:18 AM

## 2015-12-26 ENCOUNTER — Encounter: Payer: Self-pay | Admitting: Orthopaedic Surgery

## 2015-12-26 ENCOUNTER — Ambulatory Visit (INDEPENDENT_AMBULATORY_CARE_PROVIDER_SITE_OTHER): Payer: BLUE CROSS/BLUE SHIELD | Admitting: Orthopaedic Surgery

## 2015-12-26 VITALS — BP 117/74 | HR 83 | Temp 97.9°F | Ht 65.0 in | Wt 211.0 lb

## 2015-12-26 DIAGNOSIS — S8262XD Displaced fracture of lateral malleolus of left fibula, subsequent encounter for closed fracture with routine healing: Secondary | ICD-10-CM | POA: Diagnosis not present

## 2015-12-26 DIAGNOSIS — M25522 Pain in left elbow: Secondary | ICD-10-CM

## 2015-12-26 DIAGNOSIS — M25521 Pain in right elbow: Secondary | ICD-10-CM

## 2015-12-26 NOTE — Progress Notes (Signed)
CC:  My ankle is not hurting  She has no pain of the left ankle.  She has no problem, no swelling.  She has normal gait.  NV intact.  Encounter Diagnoses  Name Primary?  . Fracture of left ankle, lateral malleolus, closed, with routine healing, subsequent encounter Yes  . Left elbow pain   . Right elbow pain    I will see her PRN.  Electronically Signed Darreld McleanWayne Brityn Mastrogiovanni, MD 7/13/20173:25 PM

## 2016-11-11 ENCOUNTER — Ambulatory Visit: Payer: BLUE CROSS/BLUE SHIELD | Admitting: Obstetrics and Gynecology

## 2016-12-03 ENCOUNTER — Encounter: Payer: Self-pay | Admitting: Obstetrics and Gynecology

## 2016-12-03 ENCOUNTER — Ambulatory Visit (INDEPENDENT_AMBULATORY_CARE_PROVIDER_SITE_OTHER): Payer: Managed Care, Other (non HMO) | Admitting: Obstetrics and Gynecology

## 2016-12-03 VITALS — BP 126/72 | HR 80 | Resp 16 | Ht 64.5 in | Wt 231.0 lb

## 2016-12-03 DIAGNOSIS — L659 Nonscarring hair loss, unspecified: Secondary | ICD-10-CM | POA: Diagnosis not present

## 2016-12-03 DIAGNOSIS — N951 Menopausal and female climacteric states: Secondary | ICD-10-CM

## 2016-12-03 DIAGNOSIS — Z01419 Encounter for gynecological examination (general) (routine) without abnormal findings: Secondary | ICD-10-CM
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
## 2016-12-03 NOTE — Patient Instructions (Signed)
EXERCISE AND DIET:  We recommended that you start or continue a regular exercise program for good health. Regular exercise means any activity that makes your heart beat faster and makes you sweat.  We recommend exercising at least 30 minutes per day at least 3 days a week, preferably 4 or 5.  We also recommend a diet low in fat and sugar.  Inactivity, poor dietary choices and obesity can cause diabetes, heart attack, stroke, and kidney damage, among others.    ALCOHOL AND SMOKING:  Women should limit their alcohol intake to no more than 7 drinks/beers/glasses of wine (combined, not each!) per week. Moderation of alcohol intake to this level decreases your risk of breast cancer and liver damage. And of course, no recreational drugs are part of a healthy lifestyle.  And absolutely no smoking or even second hand smoke. Most people know smoking can cause heart and lung diseases, but did you know it also contributes to weakening of your bones? Aging of your skin?  Yellowing of your teeth and nails?  CALCIUM AND VITAMIN D:  Adequate intake of calcium and Vitamin D are recommended.  The recommendations for exact amounts of these supplements seem to change often, but generally speaking 600 mg of calcium (either carbonate or citrate) and 800 units of Vitamin D per day seems prudent. Certain women may benefit from higher intake of Vitamin D.  If you are among these women, your doctor will have told you during your visit.    PAP SMEARS:  Pap smears, to check for cervical cancer or precancers,  have traditionally been done yearly, although recent scientific advances have shown that most women can have pap smears less often.  However, every woman still should have a physical exam from her gynecologist every year. It will include a breast check, inspection of the vulva and vagina to check for abnormal growths or skin changes, a visual exam of the cervix, and then an exam to evaluate the size and shape of the uterus and  ovaries.  And after 48 years of age, a rectal exam is indicated to check for rectal cancers. We will also provide age appropriate advice regarding health maintenance, like when you should have certain vaccines, screening for sexually transmitted diseases, bone density testing, colonoscopy, mammograms, etc.   MAMMOGRAMS:  All women over 48 years old should have a yearly mammogram. Many facilities now offer a "3D" mammogram, which may cost around $50 extra out of pocket. If possible,  we recommend you accept the option to have the 3D mammogram performed.  It both reduces the number of women who will be called back for extra views which then turn out to be normal, and it is better than the routine mammogram at detecting truly abnormal areas.  Please schedule your mammogram.  COLONOSCOPY:  Colonoscopy to screen for colon cancer is recommended for all women at age 48.  We know, you hate the idea of the prep.  We agree, BUT, having colon cancer and not knowing it is worse!!  Colon cancer so often starts as a polyp that can be seen and removed at colonscopy, which can quite literally save your life!  And if your first colonoscopy is normal and you have no family history of colon cancer, most women don't have to have it again for 10 years.  Once every ten years, you can do something that may end up saving your life, right?  We will be happy to help you get it scheduled when  you are ready.  Be sure to check your insurance coverage so you understand how much it will cost.  It may be covered as a preventative service at no cost, but you should check your particular policy.  Please contact your insurance company about colonoscopy to see if it is paid if you are under 50.  This is a screening for colon cancer.

## 2016-12-06 LAB — TSH: TSH: 1.78 u[IU]/mL (ref 0.450–4.500)

## 2016-12-06 LAB — ESTRADIOL: ESTRADIOL: 6.5 pg/mL

## 2016-12-06 LAB — TESTOSTERONE, TOTAL, LC/MS/MS: Testosterone, total: 11.1 ng/dL

## 2016-12-06 LAB — FOLLICLE STIMULATING HORMONE: FSH: 29.4 m[IU]/mL

## 2017-04-19 ENCOUNTER — Other Ambulatory Visit: Payer: Self-pay | Admitting: Orthopaedic Surgery

## 2017-06-22 ENCOUNTER — Emergency Department (HOSPITAL_COMMUNITY)
Admission: EM | Admit: 2017-06-22 | Discharge: 2017-06-22 | Disposition: A | Discharge status: Home / Self Care | Payer: No Typology Code available for payment source | Attending: Emergency Medicine | Admitting: Emergency Medicine

## 2017-06-22 ENCOUNTER — Emergency Department (HOSPITAL_COMMUNITY): Payer: No Typology Code available for payment source

## 2017-06-22 DIAGNOSIS — Y939 Activity, unspecified: Secondary | ICD-10-CM | POA: Insufficient documentation

## 2017-06-22 DIAGNOSIS — S8002XA Contusion of left knee, initial encounter: Secondary | ICD-10-CM | POA: Insufficient documentation

## 2017-06-22 DIAGNOSIS — Y998 Other external cause status: Secondary | ICD-10-CM | POA: Insufficient documentation

## 2017-06-22 DIAGNOSIS — Y92410 Unspecified street and highway as the place of occurrence of the external cause: Secondary | ICD-10-CM | POA: Insufficient documentation

## 2017-06-22 DIAGNOSIS — Z79899 Other long term (current) drug therapy: Secondary | ICD-10-CM | POA: Diagnosis not present

## 2017-06-22 DIAGNOSIS — S20312A Abrasion of left front wall of thorax, initial encounter: Secondary | ICD-10-CM | POA: Insufficient documentation

## 2017-06-22 DIAGNOSIS — S8992XA Unspecified injury of left lower leg, initial encounter: Secondary | ICD-10-CM | POA: Diagnosis present

## 2017-06-22 DIAGNOSIS — J45909 Unspecified asthma, uncomplicated: Secondary | ICD-10-CM | POA: Insufficient documentation

## 2017-06-22 MED ORDER — HYDROCODONE-ACETAMINOPHEN 5-325 MG PO TABS: ORAL_TABLET | ORAL | 0 refills | Status: DC

## 2017-06-22 MED ORDER — IBUPROFEN 800 MG PO TABS: 800.0000 mg | ORAL_TABLET | Freq: Three times a day (TID) | ORAL | 0 refills | Status: DC

## 2017-06-22 NOTE — Discharge Instructions (Signed)
Elevate and apply ice packs on and off to your knee and use the crutches for weightbearing.  Wear the brace as needed with weightbearing for at least 1 week.  Call the orthopedic provider listed in 1 week to arrange a follow-up appointment if not improving.
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
## 2017-06-30 ENCOUNTER — Encounter: Payer: Self-pay | Admitting: Orthopaedic Surgery

## 2017-06-30 ENCOUNTER — Ambulatory Visit (INDEPENDENT_AMBULATORY_CARE_PROVIDER_SITE_OTHER): Payer: BLUE CROSS/BLUE SHIELD | Admitting: Orthopaedic Surgery

## 2017-06-30 VITALS — BP 142/90 | HR 121 | Ht 65.0 in | Wt 239.0 lb

## 2017-06-30 DIAGNOSIS — S8002XA Contusion of left knee, initial encounter: Secondary | ICD-10-CM

## 2017-06-30 DIAGNOSIS — M25562 Pain in left knee: Secondary | ICD-10-CM
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
## 2017-07-08 ENCOUNTER — Encounter: Payer: Self-pay | Admitting: Orthopaedic Surgery

## 2017-07-08 ENCOUNTER — Ambulatory Visit (INDEPENDENT_AMBULATORY_CARE_PROVIDER_SITE_OTHER): Payer: BLUE CROSS/BLUE SHIELD | Admitting: Orthopaedic Surgery

## 2017-07-08 VITALS — BP 147/97 | HR 95 | Temp 97.2°F | Ht 65.0 in | Wt 239.0 lb

## 2017-07-08 DIAGNOSIS — S8002XA Contusion of left knee, initial encounter: Secondary | ICD-10-CM | POA: Diagnosis not present

## 2017-07-08 DIAGNOSIS — M25562 Pain in left knee: Secondary | ICD-10-CM | POA: Diagnosis not present
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
## 2017-08-05 ENCOUNTER — Ambulatory Visit (INDEPENDENT_AMBULATORY_CARE_PROVIDER_SITE_OTHER): Payer: BLUE CROSS/BLUE SHIELD | Admitting: Orthopaedic Surgery

## 2017-08-05 ENCOUNTER — Encounter: Payer: Self-pay | Admitting: Orthopaedic Surgery

## 2017-08-05 VITALS — BP 142/89 | HR 91 | Temp 97.6°F | Ht 65.0 in | Wt 233.0 lb

## 2017-08-05 DIAGNOSIS — M25562 Pain in left knee: Secondary | ICD-10-CM

## 2017-08-05 DIAGNOSIS — G8929 Other chronic pain: Secondary | ICD-10-CM

## 2017-08-05 NOTE — Progress Notes (Signed)
0. 0. 0. 0. 0. 0. 0. 0. 0. 0. 0. 0. 0. 0. 0. 0. 0. 0. 0. 0. 0. 0. 0. 0. 0. 0. 0. 0. 0. 0. 0. 0. 0. 0. 0. 0. 0. 0. 0. 0. 0. 0. 0. 0. 0. 0. 0. 0. 0. 0. 0. 0. 0. 0. 0. 0. 0. 0. 0. 0. 0. 0. 0. 0. 0. 0. 0. 0. 0. 0. 0. 0. 0. 0. 0. 0. 0. 0. 0. 0. 0. 0. 0. 0. 0. 0. 0. 0. 0. 0. 0. 0. 0. 0. 0. 0. 0. 0. 0. 0. 0. 0. 0. 0. 0. 0. 0. 0. 0. 0. 0. 0. 0. 0. 0. 0. 0. 0. 0. 0. 0. 0. 0. 0. 0. 0. 0. 0. 0. 0. 0. 0. 0. 0. 0. 0. 0. 0. 0. 0. 0. 0. 0. 0. 0. 0. 0. 0. 0. 0. 0. 0. 0. 0. 0. 0. 0. 0. 0. 0. 0. 0. 0. 0. 0. 0. 0. 0. 0. 0. 0. 0. 0. 0. 0. 0. 0. 0. 0. 0. 0. 0. 0. 0. 0. 0. 0. 0. 0. 0. 0. 0. 0. 0. 0. 0. 0. 0. 0. 0. 0. 0. 0. 0. 0. 0. 0. 0. 0. 0. 0. 0. 0. 0. 0. 0. 0. 0. 0. 0. 0. 0. 0. 0. 0. 0. 0. 0. 0. 0. 0. 0. 0. 0. 0. 0. 0. 0. 0. 0. 0. 0. 0. 0. 0. 0. 0. 0. 0. 0. 0. 0. 0. 0. 0. 0. 0. 0. 0. 0. 0. 0. 0. 0. 0. 0. 0. 0. 0. 0. 0. 0. 0. 0. 0. 0. 0. 0. 0. 0. 0. 0. 0. 0. 0. 0. 0. 0. 0. 0. 0. 0. 0. 0. 0. 0. 0. 0. 0. 0. 0. 0. 0. 0. 0. 0. 0. 0. 0. 0. 0. 0. 0. 0. 0. 0. 0. 0. 0. 0. 0. 0. 0. 0. 0. 0. 0. 0. 0. 0. 0. 0. 0. 0. 0. 0. 0. 0. 0. 0. 0. 0. 0. 0. 0. 0. 0. 0. 0. 0. 0. 0. 0. 0. 0. 0. 0. 0. 0. 0. 0. 0. 0. 0. 0. 0. 0. 0. 0. 0. 0. 0. 0. 0. 0. 0. 0. 0. 0. 0. 0. 0. 0. 0. 0. 0. 0. 0. 0. 0. 0. 0. 0. 0. 0. 0. 0. 0. 0. 0. 0. 0. 0. 0. 0. 0. 0. 0. 0. 0. 0. 0. 0. 0. 0. 0. 0. 0. 0. 0. 0. 0. 0. 0. 0. 0. 0. 0. 0. 0. 0. 0. 0. 0. 0. 0. 0. 0. 0. 0. 0. 0. 0. 0. 0. 0. 0. 0. 0. 0. 0. 0. 0. 0. 0. 0. 0. 0. 0. 0. 0. 0. 0. 0. 0. 0. 0. 0. 0. 0. 0. 0. 0. 0. 0. 0. 0. 0. 0. 0. 0. 0. 0. 0. 0. 0. 0. 0.  0. 0. 0. 0. 0. 0. 0. 0. 0. 0. 0. 0. 0. 0. 0. 0. 0. 0. 0. 0. 0. 0. 0. 0. 0. 0. 0. 0. 0. 0. 0. 0. 0. 0. 0. 0. 0. 0. 0. 0. 0. 0. 0. 0. 0. 0. 0. 0. 0. 0. 0. 0. 0. 0. 0. 0. 0. 0. 0. 0. 0. 0. 0. 0. 0. 0. 0. 0. 0. 0. 0. 0. 0. 0. 0. 0. 0. 0. 0. 0. 0. 0. 0. 0. 0. 0. 0. 0. 0. 0. 0. 0. 0. 0. 0. 0. 0. 0. 0. 0. 0. 0. 0. 0. 0. 0. 0. 0. 0. 0. 0.   0. 0. 0. 0. 0. 0. 0. 0. 0. 0. 0. 0. 0. 0. 0. 0. 0. 0. 0. 0. 0. 0. 0. 0. 0. 0. 0. 0. 0. 0. 0. 0. 0. 0. 0. 0. 0. 0. 0. 0. 0. 0. 0. 0. 0. 0. 0. 0. 0. 0. 0. 0. 0. 0. 0. 0. 0. 0. 0. 0. 0. 0. 0. 0. 0. 0. 0. 0. 0. 0. 0. 0. 0. 0. 0. 0. 0. 0. 0. 0. 0. 0. 0. 0. 0. 0. 0. 0. 0. 0. 0. 0. 0. 0. 0. 0. 0. 0. 0. 0. 0. 0. 0. 0. 0. 0. 0. 0. 0. 0. 0. 0. 0. 0. 0. 0. 0. 0. 0. 0. 0. 0. 0. 0. 0. 0. 0. 0. 0. 0. 0. 0. 0. 0. 0. 0. 0. 0. 0. 0. 0. 0. 0. 0. 0. 0. 0. 0. 0. 0. 0. 0. 0. 0. 0. 0. 0. 0. 0. 0. 0. 0. 0. 0. 0. 0. 0. 0. 0. 0. 0. 0. 0. 0. 0. 0. 0. 0. 0. 0. 0. 0. 0. 0. 0. 0. 0. 0. 0. 0. 0. 0. 0. 0. 0. 0. 0. 0. 0. 0. 0. 0. 0. 0. 0. 0. 0. 0. 0. 0. 0. 0. 0. 0. 0. 0. 0. 0. 0. 0. 0. 0. 0. 0. 0. 0. 0. 0. 0. 0. 0. 0. 0. 0. 0. 0. 0. 0. 0. 0. 0. 0. 0. 0. 0. 0. 0. 0. 0. 0. 0. 0. 0. 0. 0. 0. 0. 0. 0. 0. 0. 0. 0. 0. 0. 0. 0. 0. 0. 0. 0. 0. 0. 0. 0. 0. 0. 0. 0. 0. 0. 0. 0. 0. 0. 0. 0. 0. 0. 0. 0. 0. 0. 0. 0. 0. 0. 0. 0. 0. 0. 0. 0. 0. 0. 0. 0. 0. 0. 0. 0. 0. 0. 0. 0. 0. 0. 0. 0. 0. 0. 0. 0. 0. 0. 0. 0. 0. 0. 0. 0. 0. 0. 0. 0. 0. 0. 0. 0. 0. 0. 0. 0. 0. 0. 0. 0. 0. 0. 0. 0. 0. 0. 0. 0. 0. 0. 0. 0. 0. 0. 0. 0. 0. 0. 0. 0. 0. 0. 0. 0. 0. 0. 0. 0. 0.  0. 0. 0. 0. 0. 0. 0. 0. 0. 0. 0. 0. 0. 0. 0. 0. 0. 0. 0. 0. 0. 0. 0. 0. 0. 0. 0. 0. 0. 0. 0. 0. 0. 0. 0. 0. 0. 0. 0. 0. 0. 0. 0. 0. 0. 0. 0. 0. 0. 0. 0. 0. 0. 0. 0. 0. 0. 0. 0. 0. 0. 0. 0. 0. 0. 0. 0. 0. 0. 0. 0. 0. 0. 0. 0. 0. 0. 0. 0. 0. 0. 0. 0. 0. 0. 0. 0. 0. 0. 0. 0. 0. 0. 0. 0. 0. 0. 0. 0. 0. 0. 0. 0. 0. 0. 0. 0. 0. 0. 0. 0. 0. 0. 0. 0. 0. 0. 0. 0. 0. 0. 0. 0. 0. 0. 0. 0. 0. 0. 0. 0. 0. 0. 0. 0. 0. 0. 0. 0. 0. 0. 0. 0. 0. 0. 0. 0. 0. 0. 0. 0. 0. 0. 0. 0. 0. 0. 0. 0. 0. 0. 0. 0. 0. 0. 0. 0. 0. 0. 0. 0. 0. 0. 0. 0. 0. 0. 0. 0. 0. 0. 0. 0. 0. 0. 0. 0. 0. 0. 0. 0. 0. 0. 0. 0. 0. 0. 0. 0. 0. 0. 0. 0. 0. 0. 0. 0. 0. 0. 0. 0. 0. 0. 0. 0. 0. 0. 0. 0. 0. 0. 0. 0.   0. 0. 0. 0. 0. 0. 0. 0. 0. 0. 0. 0. 0. 0. 0. 0. 0. 0. 0. 0. 0. 0. 0. 0. 0. 0. 0. 0. 0. 0. 0. 0. 0. 0. 0. 0. 0. 0. 0. 0. 0. 0. 0. 0. 0. 0. 0. 0. 0. 0. 0. 0. 0. 0. 0. 0. 0. 0. 0. 0. 0. 0. 0. 0. 0. 0. 0. 0. 0. 0. 0. 0. 0. 0. 0. 0. 0. 0. 0. 0. 0. 0. 0. 0. 0. 0. 0. 0. 0. 0. 0. 0. 0. 0. 0. 0. 0. 0. 0. 0. 0. 0. 0. 0. 0. 0. 0. 0. 0. 0. 0. 0. 0. 0. 0. 0. 0. 0. 0. 0. 0. 0. 0. 0. 0. 0. 0. 0. 0. 0. 0. 0. 0. 0. 0. 0. 0. 0.  

## 2017-08-05 NOTE — Progress Notes (Signed)
Patient ZO:XWRUE:Julia Mitchell, female DOB:25-Jan-1971, 47 y.o. AVW:[id number]  Chief Complaint  Patient presents with  . Knee Pain    left     HPI  Julia Mitchell is a 47 y.o. female who continues to have pain of the left knee.  Her hematoma is improved but she still has joint pain.  She is using a crutch and limping.  She has no new trauma.  I would like to get a MRI as she has not improved that much with the knee pain. I am concerned about meniscus injury. HPI  Body mass index is 38.77 kg/m.  ROS  Review of Systems  HENT: Negative for congestion.   Respiratory: Positive for shortness of breath. Negative for cough.   Cardiovascular: Negative for chest pain and leg swelling.  Endocrine: Negative for cold intolerance.  Musculoskeletal: Positive for arthralgias, gait problem and joint swelling.  Allergic/Immunologic: Negative for environmental allergies.  Neurological: Negative for numbness.  All other systems reviewed and are negative.   Past Medical History:  Diagnosis Date  . Asthma   . Heart murmur    functional--since childhood  . OAB (overactive bladder)   . Urinary incontinence    occ. with coughing, sneezing    Past Surgical History:  Procedure Laterality Date  . CESAREAN SECTION     2002, 2011  . CHOLECYSTECTOMY  2005    Family History  Problem Relation Age of Onset  . COPD Father        Dec age 49  . Hypertension Mother   . Thyroid disease Mother   . Stroke Paternal Aunt   . Rheum arthritis Maternal Grandmother   . Asthma Maternal Grandfather   . Hypertension Maternal Grandfather     Social History Social History   Tobacco Use  . Smoking status: Never Smoker  . Smokeless tobacco: Never Used  Substance Use Topics  . Alcohol use: No    Alcohol/week: 0.0 oz  . Drug use: No    Allergies  Allergen Reactions  . Penicillins Nausea And Vomiting    Current Outpatient Medications  Medication Sig Dispense Refill  . CHERATUSSIN AC 100-10 MG/5ML syrup      . fluticasone (FLONASE) 50 MCG/ACT nasal spray Place 1 spray into both nostrils every morning.   98  . HYDROcodone-acetaminophen (NORCO/VICODIN) 5-325 MG tablet Take one tab po q 4 hrs prn pain 10 tablet 0  . hydrocortisone cream 1 % Apply 1 application topically as needed for itching.    Marland Kitchen. ibuprofen (ADVIL,MOTRIN) 800 MG tablet Take 1 tablet (800 mg total) by mouth 3 (three) times daily. 21 tablet 0  . loratadine (CLARITIN) 10 MG tablet Take 10 mg by mouth daily.    . montelukast (SINGULAIR) 10 MG tablet Take 1 tablet by mouth daily.  10  . moxifloxacin (AVELOX) 400 MG tablet     . nystatin (MYCOSTATIN) powder Apply topically 3 (three) times daily. Apply to affected area for up to 7 days 15 g 2  . QVAR REDIHALER 80 MCG/ACT inhaler 2 puffs daily.    Marland Kitchen. triamcinolone cream (KENALOG) 0.1 % Apply 1 application topically 2 (two) times daily. Reported on 11/15/2015  0   No current facility-administered medications for this visit.      Physical Exam  Blood pressure (!) 142/89, pulse 91, temperature 97.6 F (36.4 C), height 5\' 5"  (1.651 m), weight 233 lb (105.7 kg).  Constitutional: overall normal hygiene, normal nutrition, well developed, normal grooming, normal body habitus. Assistive device:crutches  Musculoskeletal: gait and station Limp left, muscle tone and strength are normal, no tremors or atrophy is present.  .  Neurological: coordination overall normal.  Deep tendon reflex/nerve stretch intact.  Sensation normal.  Cranial nerves II-XII intact.   Skin:   Normal overall no scars, lesions, ulcers or rashes. No psoriasis.  Psychiatric: Alert and oriented x 3.  Recent memory intact, remote memory unclear.  Normal mood and affect. Well groomed.  Good eye contact.  Cardiovascular: overall no swelling, no varicosities, no edema bilaterally, normal temperatures of the legs and arms, no clubbing, cyanosis and good capillary refill.  Lymphatic: palpation is normal.  Left knee has  resolving hematoma anteriorly.  ROM is 0 to 100 with pain, more medially on joint line. NV intact.  She has positive medial McMurray.  All other systems reviewed and are negative   The patient has been educated about the nature of the problem(s) and counseled on treatment options.  The patient appeared to understand what I have discussed and is in agreement with it.  Encounter Diagnosis  Name Primary?  . Chronic pain of left knee Yes    PLAN Call if any problems.  Precautions discussed.  Continue current medications.   Return to clinic get MRI of the left knee   Electronically Signed Darreld Mclean, MD 2/21/20193:04 PM

## 2017-08-06 ENCOUNTER — Telehealth: Payer: Self-pay | Admitting: Orthopaedic Surgery

## 2017-08-06 NOTE — Telephone Encounter (Signed)
Patient is [name]'s patient. Julia Mitchell from [facility] called saying that the patient needs insurance pre-cert. She is scheduled for MRI on Sunday, 08/08/17 @ 3:40 pm. Her insurance is [employer].   Please advise

## 2017-08-06 NOTE — Telephone Encounter (Signed)
Glynda called, [facility] prior auth required [facility] [place] [facility].

## 2017-08-08 ENCOUNTER — Ambulatory Visit
Admission: RE | Admit: 2017-08-08 | Discharge: 2017-08-08 | Disposition: A | Discharge status: Home / Self Care | Payer: BLUE CROSS/BLUE SHIELD | Source: Ambulatory Visit | Attending: Orthopaedic Surgery | Admitting: Orthopaedic Surgery

## 2017-08-08 DIAGNOSIS — G8929 Other chronic pain: Secondary | ICD-10-CM

## 2017-08-08 DIAGNOSIS — M25562 Pain in left knee: Principal | ICD-10-CM

## 2017-08-12 ENCOUNTER — Ambulatory Visit (INDEPENDENT_AMBULATORY_CARE_PROVIDER_SITE_OTHER): Payer: BLUE CROSS/BLUE SHIELD | Admitting: Orthopaedic Surgery

## 2017-08-12 ENCOUNTER — Encounter: Payer: Self-pay | Admitting: Orthopaedic Surgery

## 2017-08-12 VITALS — BP 136/86 | HR 68 | Temp 98.1°F | Ht 65.0 in | Wt 233.0 lb

## 2017-08-12 DIAGNOSIS — G8929 Other chronic pain: Secondary | ICD-10-CM

## 2017-08-12 DIAGNOSIS — M25562 Pain in left knee: Secondary | ICD-10-CM | POA: Diagnosis not present

## 2017-08-12 DIAGNOSIS — M238X2 Other internal derangements of left knee: Secondary | ICD-10-CM

## 2017-08-12 NOTE — Progress Notes (Signed)
Patient Julia Mitchell, female DOB:1971/04/02, 47 y.o. AVW:[id number]  Chief Complaint  Patient presents with  . Follow-up    Review MRI results of left knee.    HPI  Julia Mitchell is a 47 y.o. female who has left knee pain and left patella pain.  She has no trauma.  She uses one crutch and a knee sleeve.  She still has pain.  She had a MRI which showed: IMPRESSION: 1. Chondral contusion and chondral fissure versus flap type tear at the patellar apex. 2. Intact ligamentous structures and no acute bony findings. 3. No meniscal tears. 4. 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 5. No joint effusion.  Very small Baker's cyst.  I have explained the findings to her.  I have told her that surgery at this point and time will not help.  She will need to continue the Naprosyn and the crutch or cane.  I have given handicap sticker application which I signed. HPI  Body mass index is 38.77 kg/m.  ROS  Review of Systems  HENT: Negative for congestion.   Respiratory: Positive for shortness of breath. Negative for cough.   Cardiovascular: Negative for chest pain and leg swelling.  Endocrine: Negative for cold intolerance.  Musculoskeletal: Positive for arthralgias, gait problem and joint swelling.  Allergic/Immunologic: Negative for environmental allergies.  Neurological: Negative for numbness.  All other systems reviewed and are negative.   Past Medical History:  Diagnosis Date  . Asthma   . Heart murmur    functional--since childhood  . OAB (overactive bladder)   . Urinary incontinence    occ. with coughing, sneezing    Past Surgical History:  Procedure Laterality Date  . CESAREAN SECTION     2002, 2011  . CHOLECYSTECTOMY  2005    Family History  Problem Relation Age of Onset  . COPD Father        Dec age 11  . Hypertension Mother   . Thyroid disease Mother   . Stroke Paternal Aunt   . Rheum arthritis Maternal Grandmother    . Asthma Maternal Grandfather   . Hypertension Maternal Grandfather     Social History Social History   Tobacco Use  . Smoking status: Never Smoker  . Smokeless tobacco: Never Used  Substance Use Topics  . Alcohol use: No    Alcohol/week: 0.0 oz  . Drug use: No    Allergies  Allergen Reactions  . Penicillins Nausea And Vomiting    Current Outpatient Medications  Medication Sig Dispense Refill  . CHERATUSSIN AC 100-10 MG/5ML syrup     . fluticasone (FLONASE) 50 MCG/ACT nasal spray Place 1 spray into both nostrils every morning.   98  . HYDROcodone-acetaminophen (NORCO/VICODIN) 5-325 MG tablet Take one tab po q 4 hrs prn pain 10 tablet 0  . hydrocortisone cream 1 % Apply 1 application topically as needed for itching.    Marland Kitchen ibuprofen (ADVIL,MOTRIN) 800 MG tablet Take 1 tablet (800 mg total) by mouth 3 (three) times daily. 21 tablet 0  . loratadine (CLARITIN) 10 MG tablet Take 10 mg by mouth daily.    . montelukast (SINGULAIR) 10 MG tablet Take 1 tablet by mouth daily.  10  . moxifloxacin (AVELOX) 400 MG tablet     . nystatin (MYCOSTATIN) powder Apply topically 3 (three) times daily. Apply to affected area for up to 7 days 15 g 2  . QVAR REDIHALER 80 MCG/ACT inhaler 2 puffs daily.    Marland Kitchen triamcinolone cream (  KENALOG) 0.1 % Apply 1 application topically 2 (two) times daily. Reported on 11/15/2015  0   No current facility-administered medications for this visit.      Physical Exam  Blood pressure 136/86, pulse 68, temperature 98.1 F (36.7 C), height 5\' 5"  (1.651 m), weight 233 lb (105.7 kg).  Constitutional: overall normal hygiene, normal nutrition, well developed, normal grooming, normal body habitus. Assistive device:One crutch  Musculoskeletal: gait and station Limp left, muscle tone and strength are normal, no tremors or atrophy is present.  .  Neurological: coordination overall normal.  Deep tendon reflex/nerve stretch intact.  Sensation normal.  Cranial nerves II-XII  intact.   Skin:   Normal overall no scars, lesions, ulcers or rashes. No psoriasis.  Psychiatric: Alert and oriented x 3.  Recent memory intact, remote memory unclear.  Normal mood and affect. Well groomed.  Good eye contact.  Cardiovascular: overall no swelling, no varicosities, no edema bilaterally, normal temperatures of the legs and arms, no clubbing, cyanosis and good capillary refill.  Lymphatic: palpation is normal.  Left knee is tender around the patella, slight effusion, ROM 0 to 105, NV intact. She has no pain of the medial hamstrings.  Limp to the left.  All other systems reviewed and are negative   The patient has been educated about the nature of the problem(s) and counseled on treatment options.  The patient appeared to understand what I have discussed and is in agreement with it.  Encounter Diagnoses  Name Primary?  . Chondral defect of left patella   . Chronic pain of left knee Yes    PLAN Call if any problems.  Precautions discussed.  Continue current medications.   Return to clinic 3 months   Electronically Signed Darreld McleanWayne Wendall Isabell, MD 2/28/20199:33 AM

## 2017-09-13 ENCOUNTER — Other Ambulatory Visit: Payer: Self-pay | Admitting: Orthopaedic Surgery

## 2017-09-13 MED ORDER — IBUPROFEN 800 MG PO TABS: 800.0000 mg | ORAL_TABLET | Freq: Three times a day (TID) | ORAL | 0 refills | Status: DC

## 2017-09-13 NOTE — Telephone Encounter (Signed)
Per fax: Ibuprofen 600 MG  Qty  100ablets  Take (1)  tablet by mouth eery six hours as needed  PATIENT USES [facility]

## 2017-11-11 ENCOUNTER — Encounter: Payer: Self-pay | Admitting: Orthopaedic Surgery

## 2017-11-11 ENCOUNTER — Ambulatory Visit (INDEPENDENT_AMBULATORY_CARE_PROVIDER_SITE_OTHER): Payer: BLUE CROSS/BLUE SHIELD | Admitting: Orthopaedic Surgery

## 2017-11-11 VITALS — BP 153/94 | HR 94 | Temp 98.8°F | Ht 65.0 in | Wt 238.0 lb

## 2017-11-11 DIAGNOSIS — M238X2 Other internal derangements of left knee: Secondary | ICD-10-CM

## 2017-11-11 DIAGNOSIS — M25562 Pain in left knee: Secondary | ICD-10-CM | POA: Diagnosis not present

## 2017-11-11 DIAGNOSIS — G8929 Other chronic pain: Secondary | ICD-10-CM | POA: Diagnosis not present

## 2017-11-11 MED ORDER — IBUPROFEN 600 MG PO TABS: 600.0000 mg | ORAL_TABLET | Freq: Four times a day (QID) | ORAL | 5 refills | Status: DC | PRN

## 2017-11-11 NOTE — Progress Notes (Signed)
CC:  My knee is better  She has pain in the left knee still but it is less.  She uses a brace. She has no new trauma, no giving way.  ROM of the left knee is 0 to 115, it is stable, NV intact.  She has slight effusion and crepitus.  Encounter Diagnoses  Name Primary?  . Chondral defect of left patella Yes  . Chronic pain of left knee    I will see her as needed.  I have called in Rx for ibuprofen 600  Call if any problem.  Precautions discussed.   Electronically Signed Darreld Mclean, MD 5/30/20191:53 PM

## 2017-12-08 ENCOUNTER — Ambulatory Visit (INDEPENDENT_AMBULATORY_CARE_PROVIDER_SITE_OTHER): Payer: BLUE CROSS/BLUE SHIELD | Admitting: Obstetrics and Gynecology

## 2017-12-08 ENCOUNTER — Other Ambulatory Visit: Payer: Self-pay

## 2017-12-08 ENCOUNTER — Other Ambulatory Visit: Payer: Self-pay | Admitting: Obstetrics and Gynecology

## 2017-12-08 ENCOUNTER — Encounter: Payer: Self-pay | Admitting: Obstetrics and Gynecology

## 2017-12-08 VITALS — BP 130/78 | HR 84 | Resp 18 | Ht 65.0 in | Wt 231.0 lb

## 2017-12-08 DIAGNOSIS — Z1211 Encounter for screening for malignant neoplasm of colon: Secondary | ICD-10-CM | POA: Diagnosis not present

## 2017-12-08 DIAGNOSIS — N926 Irregular menstruation, unspecified: Secondary | ICD-10-CM

## 2017-12-08 DIAGNOSIS — Z01419 Encounter for gynecological examination (general) (routine) without abnormal findings: Secondary | ICD-10-CM

## 2017-12-08 DIAGNOSIS — R21 Rash and other nonspecific skin eruption: Secondary | ICD-10-CM

## 2017-12-08 DIAGNOSIS — Z1231 Encounter for screening mammogram for malignant neoplasm of breast: Secondary | ICD-10-CM

## 2017-12-08 NOTE — Patient Instructions (Signed)

## 2017-12-08 NOTE — Progress Notes (Signed)
47 y.o. G30P2104 Married Caucasian female here for annual exam.    Menses 3 - 4 in the last year. Bleeds for about 1 - 2 days at a time. One day is more of a normal flow.   No hot flashes.   Occasional leakage of urine with a sneeze.  Not getting worse.  No urgency or frequency.   Does not need refill of Nystatin powder.  Itchy red cheeks.   Twins will graduate from HS this next year.   PCP:  Dr. Phillips Odor.   LMP: 07/2017 Had BTL       Sexually active: Yes.    The current method of family planning is tubal ligation.    Exercising: No.  Occ  Joined the [employer]. Smoker:  no  Health Maintenance: Pap:   11/07/15 Pap and HR HPV negative             11-02-14 Neg:Neg HR HPV History of abnormal Pap:  no MMG:  11-29-14 3D/Density B/Neg/BiRads1 Colonoscopy:  none BMD:   NA    TDaP:  Patient is not sure Gardasil:   no HIV:NA Hep C:NA Screening Labs:   ---   reports that she has never smoked. She has never used smokeless tobacco. She reports that she does not drink alcohol or use drugs.  Past Medical History:  Diagnosis Date  . Asthma   . Heart murmur    functional--since childhood  . OAB (overactive bladder)   . Urinary incontinence    occ. with coughing, sneezing    Past Surgical History:  Procedure Laterality Date  . CESAREAN SECTION     2002, 2011  . CHOLECYSTECTOMY  2005    Current Outpatient Medications  Medication Sig Dispense Refill  . fluticasone (FLONASE) 50 MCG/ACT nasal spray Place 1 spray into both nostrils every morning.   98  . ibuprofen (ADVIL,MOTRIN) 600 MG tablet Take 1 tablet (600 mg total) by mouth every 6 (six) hours as needed. 100 tablet 5  . loratadine (CLARITIN) 10 MG tablet Take 10 mg by mouth daily.    . montelukast (SINGULAIR) 10 MG tablet Take 1 tablet by mouth daily.  10  . nystatin (MYCOSTATIN) powder Apply topically 3 (three) times daily. Apply to affected area for up to 7 days 15 g 2  . QVAR REDIHALER 80 MCG/ACT inhaler 2 puffs daily.    Marland Kitchen  CHERATUSSIN AC 100-10 MG/5ML syrup     . HYDROcodone-acetaminophen (NORCO/VICODIN) 5-325 MG tablet Take one tab po q 4 hrs prn pain (Patient not taking: Reported on 12/08/2017) 10 tablet 0  . hydrocortisone cream 1 % Apply 1 application topically as needed for itching.    . moxifloxacin (AVELOX) 400 MG tablet     . triamcinolone cream (KENALOG) 0.1 % Apply 1 application topically 2 (two) times daily. Reported on 11/15/2015  0   No current facility-administered medications for this visit.     Family History  Problem Relation Age of Onset  . COPD Father        Dec age 18  . Hypertension Mother   . Thyroid disease Mother   . Stroke Paternal Aunt   . Rheum arthritis Maternal Grandmother   . Asthma Maternal Grandfather   . Hypertension Maternal Grandfather     Review of Systems  Constitutional: Negative.   HENT: Negative.   Eyes: Negative.   Respiratory: Negative.   Cardiovascular: Negative.   Gastrointestinal: Negative.   Endocrine: Negative.   Genitourinary: Negative.   Musculoskeletal: Negative.  Allergic/Immunologic: Negative.   Neurological: Negative.   Hematological: Negative.   Psychiatric/Behavioral: Negative.     Exam:   BP 130/78 (BP Location: Right Arm, Patient Position: Sitting, Cuff Size: Large)   Pulse 84   Resp 18   Ht 5\' 5"  (1.651 m)   Wt 231 lb (104.8 kg)   BMI 38.44 kg/m     General appearance: alert, cooperative and appears stated age Head: Normocephalic, without obvious abnormality, atraumatic Neck: no adenopathy, supple, symmetrical, trachea midline and thyroid normal to inspection and palpation Lungs: clear to auscultation bilaterally Breasts: normal appearance, no masses or tenderness, No nipple retraction or dimpling, No nipple discharge or bleeding, No axillary or supraclavicular adenopathy Heart: regular rate and rhythm Abdomen: soft, non-tender; no masses, no organomegaly Extremities: extremities normal, atraumatic, no cyanosis or edema Skin:  Skin color, texture, turgor normal. No rashes or lesions Lymph nodes: Cervical, supraclavicular, and axillary nodes normal. No abnormal inguinal nodes palpated Neurologic: Grossly normal  Pelvic: External genitalia:  no lesions              Urethra:  normal appearing urethra with no masses, tenderness or lesions              Bartholins and Skenes: normal                 Vagina: normal appearing vagina with normal color and discharge, no lesions              Cervix: no lesions              Pap taken: No. Bimanual Exam:  Uterus:  normal size, contour, position, consistency, mobility, non-tender              Adnexa: no mass, fullness, tenderness              Rectal exam: Yes.  .  Confirms.              Anus:  normal sphincter tone, no lesions  Chaperone was present for exam.  Assessment:   Well woman visit with normal exam. Mild GSI.  Facial rash.   Plan: Mammogram screening.  She will schedule.  Recommended self breast awareness. Pap and HR HPV as above. Guidelines for Calcium, Vitamin D, regular exercise program including cardiovascular and weight bearing exercise. Referral to dermatology. FSH and E2.  IFOB.  Labs with PCP.  We talked about slow and gradual weight loss through healthy eating and exercise. Follow up annually and prn.    After visit summary provided.

## 2017-12-09 LAB — FOLLICLE STIMULATING HORMONE: FSH: 29.2 m[IU]/mL

## 2017-12-09 LAB — ESTRADIOL: Estradiol: 20.6 pg/mL

## 2017-12-29 ENCOUNTER — Ambulatory Visit
Admission: RE | Admit: 2017-12-29 | Discharge: 2017-12-29 | Disposition: A | Discharge status: Home / Self Care | Payer: BLUE CROSS/BLUE SHIELD | Source: Ambulatory Visit | Attending: Obstetrics and Gynecology | Admitting: Obstetrics and Gynecology

## 2017-12-29 DIAGNOSIS — Z1231 Encounter for screening mammogram for malignant neoplasm of breast: Secondary | ICD-10-CM

## 2018-02-27 ENCOUNTER — Telehealth: Payer: Self-pay | Admitting: Obstetrics and Gynecology

## 2018-02-27 NOTE — Telephone Encounter (Signed)
Please contact patient to remind her to send her IFOB in to the lab.  She came up in my reminder box in Epic.  

## 2018-02-28 NOTE — Telephone Encounter (Signed)
Left message reminding patient to send her IFOB sample in.

## 2018-12-12 ENCOUNTER — Other Ambulatory Visit: Payer: Self-pay | Admitting: Orthopaedic Surgery

## 2018-12-28 ENCOUNTER — Other Ambulatory Visit (HOSPITAL_COMMUNITY)
Admission: RE | Admit: 2018-12-28 | Discharge: 2018-12-28 | Disposition: A | Discharge status: Home / Self Care | Payer: Managed Care, Other (non HMO) | Source: Ambulatory Visit | Attending: Obstetrics and Gynecology | Admitting: Obstetrics and Gynecology

## 2018-12-28 ENCOUNTER — Encounter: Payer: Self-pay | Admitting: Obstetrics and Gynecology

## 2018-12-28 ENCOUNTER — Other Ambulatory Visit: Payer: Self-pay

## 2018-12-28 ENCOUNTER — Ambulatory Visit (INDEPENDENT_AMBULATORY_CARE_PROVIDER_SITE_OTHER): Payer: Managed Care, Other (non HMO) | Admitting: Obstetrics and Gynecology

## 2018-12-28 VITALS — BP 126/78 | HR 88 | Temp 97.3°F | Resp 14 | Ht 64.75 in | Wt 243.0 lb

## 2018-12-28 DIAGNOSIS — Z01419 Encounter for gynecological examination (general) (routine) without abnormal findings: Secondary | ICD-10-CM | POA: Diagnosis not present

## 2018-12-28 DIAGNOSIS — N951 Menopausal and female climacteric states: Secondary | ICD-10-CM

## 2018-12-28 NOTE — Progress Notes (Signed)
48 y.o. 553P2104 Married Caucasian female here for annual exam.    Working during the pandemic.  Twins graduated from HS. Son graduating from [employer].   Prior LMP was November, 2019.  Not hot flashes.   She did not see dermatology for her facial rash.  She states it got better.  Now she has some acne due to wearing a face mask.   She will do labs with PCP.   PCP:  Assunta FoundJohn Golding, MD   Patient's last menstrual period was 10/28/2018 (within days).           Sexually active: Yes.    The current method of family planning is tubal ligation.    Exercising: No.  The patient does not participate in regular exercise at present. Smoker:  no  Health Maintenance: Pap:  11/07/15 Neg:Neg HR HPV History of abnormal Pap:  no MMG:  12/29/17 BIRADS 1 negative/density b Colonoscopy:  never TDaP:  [facility] HIV: never Hep C: never Screening Labs:  PCP   reports that she has never smoked. She has never used smokeless tobacco. She reports that she does not drink alcohol or use drugs.  Past Medical History:  Diagnosis Date  . Asthma   . Heart murmur    functional--since childhood  . OAB (overactive bladder)   . Urinary incontinence    occ. with coughing, sneezing    Past Surgical History:  Procedure Laterality Date  . CESAREAN SECTION     2002, 2011  . CHOLECYSTECTOMY  2005    Current Outpatient Medications  Medication Sig Dispense Refill  . fluticasone (FLONASE) 50 MCG/ACT nasal spray Place 1 spray into both nostrils every morning.   98  . IBU 600 MG tablet TAKE ONE TABLET BY MOUTH EVERY 6 HOURS AS NEEDED. 100 tablet 5  . loratadine (CLARITIN) 10 MG tablet Take 10 mg by mouth daily.    . montelukast (SINGULAIR) 10 MG tablet Take 1 tablet by mouth daily.  10  . moxifloxacin (AVELOX) 400 MG tablet     . QVAR REDIHALER 80 MCG/ACT inhaler 2 puffs daily.    Marland Kitchen. CHERATUSSIN AC 100-10 MG/5ML syrup     . triamcinolone cream (KENALOG) 0.1 % Apply 1 application  topically 2 (two) times daily. Reported on 11/15/2015  0   No current facility-administered medications for this visit.     Family History  Problem Relation Age of Onset  . COPD Father        Dec age 50  . Hypertension Mother   . Thyroid disease Mother   . Stroke Paternal Aunt   . Rheum arthritis Maternal Grandmother   . Asthma Maternal Grandfather   . Hypertension Maternal Grandfather     Review of Systems  Constitutional: Negative.   HENT: Negative.   Eyes: Negative.   Respiratory: Negative.   Cardiovascular: Negative.   Gastrointestinal: Negative.   Endocrine: Negative.   Genitourinary: Negative.   Musculoskeletal: Negative.   Skin: Negative.   Allergic/Immunologic: Negative.   Neurological: Negative.   Hematological: Negative.   Psychiatric/Behavioral: Negative.     Exam:   BP 126/78 (BP Location: Right Arm, Patient Position: Sitting, Cuff Size: Large)   Pulse 88   Temp (!) 97.3 F (36.3 C) (Temporal)   Resp 14   Ht 5' 4.75" (1.645 m)   Wt 243 lb (110.2 kg)   LMP 10/28/2018 (Within Days)   BMI 40.75 kg/m     General appearance: alert, cooperative and appears  stated age Head: normocephalic, without obvious abnormality, atraumatic Neck: no adenopathy, supple, symmetrical, trachea midline and thyroid normal to inspection and palpation Lungs: clear to auscultation bilaterally Breasts: normal appearance, no masses or tenderness, No nipple retraction or dimpling, No nipple discharge or bleeding, No axillary adenopathy Heart: regular rate and rhythm Abdomen: soft, non-tender; no masses, no organomegaly Extremities: extremities normal, atraumatic, no cyanosis or edema Skin: skin color, texture, turgor normal. No rashes or lesions Lymph nodes: cervical, supraclavicular, and axillary nodes normal. Neurologic: grossly normal  Pelvic: External genitalia:  no lesions              No abnormal inguinal nodes palpated.              Urethra:  normal appearing urethra with  no masses, tenderness or lesions              Bartholins and Skenes: normal                 Vagina: normal appearing vagina with normal color and discharge, no lesions              Cervix: no lesions              Pap taken: Yes.   Bimanual Exam:  Uterus:  normal size, contour, position, consistency, mobility, non-tender              Adnexa: no mass, fullness, tenderness              Rectal exam: No..  Confirms.              Anus:  normal sphincter tone, no lesions  Chaperone was present for exam.  Assessment:   Well woman visit with normal exam. Mild GSI.  Oligomenorrhea.  Plan: Mammogram screening discussed.  She will schedule.  Self breast awareness reviewed. Pap and HR HPV as above. Guidelines for Calcium, Vitamin D, regular exercise program including cardiovascular and weight bearing exercise. FSH, E2, routine labs.  She will do her colonoscopy through her PCP. Follow up annually and prn.    After visit summary provided.

## 2018-12-28 NOTE — Patient Instructions (Signed)

## 2018-12-29 LAB — COMPREHENSIVE METABOLIC PANEL
ALT: 40 IU/L — ABNORMAL HIGH (ref 0–32)
AST: 70 IU/L — ABNORMAL HIGH (ref 0–40)
Albumin/Globulin Ratio: 1.6 (ref 1.2–2.2)
Albumin: 4.1 g/dL (ref 3.8–4.8)
Alkaline Phosphatase: 72 IU/L (ref 39–117)
BUN/Creatinine Ratio: 16 (ref 9–23)
BUN: 12 mg/dL (ref 6–24)
Bilirubin Total: 0.4 mg/dL (ref 0.0–1.2)
CO2: 25 mmol/L (ref 20–29)
Calcium: 9.5 mg/dL (ref 8.7–10.2)
Chloride: 99 mmol/L (ref 96–106)
Creatinine, Ser: 0.75 mg/dL (ref 0.57–1.00)
GFR calc Af Amer: 107 mL/min/{1.73_m2} (ref >59)
GFR calc non Af Amer: 93 mL/min/{1.73_m2} (ref >59)
Globulin, Total: 2.5 g/dL (ref 1.5–4.5)
Glucose: 90 mg/dL (ref 65–99)
Potassium: 4.2 mmol/L (ref 3.5–5.2)
Sodium: 141 mmol/L (ref 134–144)
Total Protein: 6.6 g/dL (ref 6.0–8.5)

## 2018-12-29 LAB — LIPID PANEL
Chol/HDL Ratio: 6 ratio — ABNORMAL HIGH (ref 0.0–4.4)
Cholesterol, Total: 187 mg/dL (ref 100–199)
HDL: 31 mg/dL — ABNORMAL LOW (ref >39)
LDL Calculated: 111 mg/dL — ABNORMAL HIGH (ref 0–99)
Triglycerides: 224 mg/dL — ABNORMAL HIGH (ref 0–149)
VLDL Cholesterol Cal: 45 mg/dL — ABNORMAL HIGH (ref 5–40)

## 2018-12-29 LAB — CBC
Hematocrit: 38.8 % (ref 34.0–46.6)
Hemoglobin: 12.8 g/dL (ref 11.1–15.9)
MCH: 29.6 pg (ref 26.6–33.0)
MCHC: 33 g/dL (ref 31.5–35.7)
MCV: 90 fL (ref 79–97)
Platelets: 365 10*3/uL (ref 150–450)
RBC: 4.33 x10E6/uL (ref 3.77–5.28)
RDW: 13.1 % (ref 11.7–15.4)
WBC: 11.4 10*3/uL — ABNORMAL HIGH (ref 3.4–10.8)

## 2018-12-29 LAB — TSH: TSH: 2.4 u[IU]/mL (ref 0.450–4.500)

## 2018-12-29 LAB — FOLLICLE STIMULATING HORMONE: FSH: 22.1 m[IU]/mL

## 2018-12-29 LAB — ESTRADIOL: Estradiol: 22.7 pg/mL

## 2019-01-01 ENCOUNTER — Encounter: Payer: Self-pay | Admitting: Obstetrics and Gynecology

## 2019-01-02 ENCOUNTER — Other Ambulatory Visit: Payer: Self-pay | Admitting: *Deleted

## 2019-01-02 LAB — CYTOLOGY - PAP
Diagnosis: NEGATIVE
HPV: NOT DETECTED

## 2019-01-02 MED ORDER — MEDROXYPROGESTERONE ACETATE 10 MG PO TABS: 10.0000 mg | ORAL_TABLET | Freq: Every day | ORAL | 0 refills | Status: DC

## 2019-02-01 ENCOUNTER — Other Ambulatory Visit: Payer: Self-pay | Admitting: Obstetrics and Gynecology

## 2019-02-01 DIAGNOSIS — Z1231 Encounter for screening mammogram for malignant neoplasm of breast: Secondary | ICD-10-CM

## 2019-02-02 ENCOUNTER — Ambulatory Visit
Admission: RE | Admit: 2019-02-02 | Discharge: 2019-02-02 | Disposition: A | Discharge status: Home / Self Care | Payer: Managed Care, Other (non HMO) | Source: Ambulatory Visit | Attending: Obstetrics and Gynecology | Admitting: Obstetrics and Gynecology

## 2019-02-02 ENCOUNTER — Other Ambulatory Visit: Payer: Self-pay

## 2019-02-02 DIAGNOSIS — Z1231 Encounter for screening mammogram for malignant neoplasm of breast: Secondary | ICD-10-CM

## 2019-06-12 ENCOUNTER — Ambulatory Visit: Payer: Managed Care, Other (non HMO) | Attending: Internal Medicine

## 2019-06-12 ENCOUNTER — Other Ambulatory Visit: Payer: Self-pay

## 2019-06-12 DIAGNOSIS — Z20822 Contact with and (suspected) exposure to covid-19: Secondary | ICD-10-CM

## 2019-06-14 LAB — NOVEL CORONAVIRUS, NAA: SARS-CoV-2, NAA: NOT DETECTED

## 2019-06-15 ENCOUNTER — Telehealth: Payer: Self-pay | Admitting: *Deleted

## 2019-06-15 NOTE — Telephone Encounter (Signed)
Calling for Covid results. None available as of yet. Referred to check MyChart later today.

## 2019-06-19 ENCOUNTER — Ambulatory Visit: Payer: Managed Care, Other (non HMO) | Attending: Internal Medicine

## 2019-06-19 ENCOUNTER — Other Ambulatory Visit: Payer: Self-pay

## 2019-06-19 DIAGNOSIS — Z20822 Contact with and (suspected) exposure to covid-19: Secondary | ICD-10-CM

## 2019-06-20 LAB — NOVEL CORONAVIRUS, NAA: SARS-CoV-2, NAA: NOT DETECTED

## 2019-12-27 NOTE — Progress Notes (Signed)
49 y.o. G2P2104 Married Caucasian female here for annual exam.    FSH 22 and E2 22.7 on 12/28/18. Occasional night sweat.  She took Provera after these labs were back, and had spotting only.   Good bladder control.   She will see her PCP next week.  She has not had a Covid vaccine yet.   PCP:  Assunta Found, MD   Patient's last menstrual period was 10/28/2018 (approximate).           Sexually active: No.  The current method of family planning is tubal ligation.    Exercising: No.  The patient does not participate in regular exercise at present. Smoker:  no  Health Maintenance: Pap: 12-28-18 Neg:Neg HR HPV, 11/07/15 Neg:Neg HR HPV, 11-02-14 Neg:Neg HR HPV History of abnormal Pap:  no MMG: 02-02-19 Neg/density B/Birads1 Colonoscopy: NEVER.  She will arrange this through her PCP.  BMD:   n/a  Result  n/a TDaP: PCP Gardasil:   no WUJ:WJXBJ Hep C:never Screening Labs:  PCP.    reports that she has never smoked. She has never used smokeless tobacco. She reports that she does not drink alcohol and does not use drugs.  Past Medical History:  Diagnosis Date  . Asthma   . Elevated liver function tests   . Elevated WBC count   . Heart murmur    functional--since childhood  . Hyperlipidemia   . OAB (overactive bladder)   . Urinary incontinence    occ. with coughing, sneezing    Past Surgical History:  Procedure Laterality Date  . CESAREAN SECTION     2002, 2011  . CHOLECYSTECTOMY  2005    Current Outpatient Medications  Medication Sig Dispense Refill  . IBU 600 MG tablet TAKE ONE TABLET BY MOUTH EVERY 6 HOURS AS NEEDED. 100 tablet 5  . loratadine (CLARITIN) 10 MG tablet Take 10 mg by mouth daily.    . montelukast (SINGULAIR) 10 MG tablet Take 1 tablet by mouth daily.  10  . QVAR REDIHALER 80 MCG/ACT inhaler 2 puffs daily.     No current facility-administered medications for this visit.    Family History  Problem Relation Age of Onset  . COPD Father        Dec age 33   . Hypertension Mother   . Thyroid disease Mother   . Stroke Paternal Aunt   . Rheum arthritis Maternal Grandmother   . Asthma Maternal Grandfather   . Hypertension Maternal Grandfather     Review of Systems  All other systems reviewed and are negative.   Exam:   BP (!) 160/90 (Cuff Size: Large)   Pulse 84   Resp 18   Ht 5' 4.5" (1.638 m)   Wt 238 lb 9.6 oz (108.2 kg)   LMP 10/28/2018 (Approximate)   BMI 40.32 kg/m     General appearance: alert, cooperative and appears stated age Head: normocephalic, without obvious abnormality, atraumatic Neck: no adenopathy, supple, symmetrical, trachea midline and thyroid normal to inspection and palpation Lungs: clear to auscultation bilaterally Breasts: normal appearance, no masses or tenderness, No nipple retraction or dimpling, No nipple discharge or bleeding, No axillary adenopathy Heart: regular rate and rhythm Abdomen: soft, non-tender; no masses, no organomegaly Extremities: extremities normal, atraumatic, no cyanosis or edema Skin: skin color, texture, turgor normal. No rashes or lesions Lymph nodes: cervical, supraclavicular, and axillary nodes normal. Neurologic: grossly normal  Pelvic: External genitalia:  no lesions  No abnormal inguinal nodes palpated.              Urethra:  normal appearing urethra with no masses, tenderness or lesions              Bartholins and Skenes: normal                 Vagina: normal appearing vagina with normal color and discharge, no lesions              Cervix: Few dots of erythema on cervix.  Squamous metaplasia?  Atrophy?              Pap taken: Yes.   Bimanual Exam:  Uterus:  normal size, contour, position, consistency, mobility, non-tender              Adnexa: no mass, fullness, tenderness              Rectal exam: Yes.  .  Confirms.              Anus:  normal sphincter tone, no lesions  Chaperone was present for exam.  Assessment:   Well woman visit with normal  exam. Menopausal symptoms.  Elevated BP reading.   Plan: Mammogram screening discussed. Self breast awareness reviewed. Pap and HR HPV today.  Check FSH and estradiol. Guidelines for Calcium, Vitamin D, regular exercise program including cardiovascular and weight bearing exercise. FU with PCP for BP check.  Follow up annually and prn.    After visit summary provided.

## 2019-12-30 ENCOUNTER — Other Ambulatory Visit: Payer: Self-pay | Admitting: Orthopaedic Surgery

## 2020-01-01 ENCOUNTER — Other Ambulatory Visit (HOSPITAL_COMMUNITY)
Admission: RE | Admit: 2020-01-01 | Discharge: 2020-01-01 | Disposition: A | Discharge status: Home / Self Care | Payer: Managed Care, Other (non HMO) | Source: Ambulatory Visit | Attending: Obstetrics and Gynecology | Admitting: Obstetrics and Gynecology

## 2020-01-01 ENCOUNTER — Encounter: Payer: Self-pay | Admitting: Obstetrics and Gynecology

## 2020-01-01 ENCOUNTER — Other Ambulatory Visit: Payer: Self-pay | Admitting: Obstetrics and Gynecology

## 2020-01-01 ENCOUNTER — Ambulatory Visit: Payer: Managed Care, Other (non HMO) | Admitting: Obstetrics and Gynecology

## 2020-01-01 ENCOUNTER — Other Ambulatory Visit: Payer: Self-pay

## 2020-01-01 VITALS — BP 160/90 | HR 84 | Resp 18 | Ht 64.5 in | Wt 238.6 lb

## 2020-01-01 DIAGNOSIS — N951 Menopausal and female climacteric states: Secondary | ICD-10-CM | POA: Diagnosis not present

## 2020-01-01 DIAGNOSIS — Z1231 Encounter for screening mammogram for malignant neoplasm of breast: Secondary | ICD-10-CM

## 2020-01-01 DIAGNOSIS — Z01419 Encounter for gynecological examination (general) (routine) without abnormal findings: Secondary | ICD-10-CM

## 2020-01-01 NOTE — Patient Instructions (Signed)

## 2020-01-02 LAB — ESTRADIOL: Estradiol: 12.2 pg/mL

## 2020-01-02 LAB — FOLLICLE STIMULATING HORMONE: FSH: 27.4 m[IU]/mL

## 2020-01-03 LAB — CYTOLOGY - PAP
Comment: NEGATIVE
Diagnosis: UNDETERMINED — AB
High risk HPV: NEGATIVE

## 2020-02-08 ENCOUNTER — Other Ambulatory Visit: Payer: Self-pay

## 2020-02-08 ENCOUNTER — Ambulatory Visit
Admission: RE | Admit: 2020-02-08 | Discharge: 2020-02-08 | Disposition: A | Discharge status: Home / Self Care | Payer: Managed Care, Other (non HMO) | Source: Ambulatory Visit | Attending: Obstetrics and Gynecology | Admitting: Obstetrics and Gynecology

## 2020-02-08 DIAGNOSIS — Z1231 Encounter for screening mammogram for malignant neoplasm of breast: Secondary | ICD-10-CM

## 2020-02-12 ENCOUNTER — Other Ambulatory Visit: Payer: Self-pay | Admitting: Obstetrics and Gynecology

## 2020-02-12 ENCOUNTER — Telehealth: Payer: Self-pay | Admitting: Obstetrics and Gynecology

## 2020-02-12 DIAGNOSIS — R928 Other abnormal and inconclusive findings on diagnostic imaging of breast: Secondary | ICD-10-CM

## 2020-02-12 NOTE — Telephone Encounter (Signed)
Please disregard this note.   Patient has been advised already of results.   I will close the encounter.

## 2020-02-12 NOTE — Telephone Encounter (Signed)
Please let patient know that her hormonal blood work from 01/01/20 shows she is in menopause.   Her FSH was 27.4 and her estradiol was 12.2.   Please have her let me know if she has any future vaginal bleeding.  I am sorry for the delay in getting the results to her. For some reason, the results did not appear in Epic, the electronic medical record.

## 2020-02-23 ENCOUNTER — Ambulatory Visit: Payer: Managed Care, Other (non HMO)

## 2020-02-23 ENCOUNTER — Ambulatory Visit
Admission: RE | Admit: 2020-02-23 | Discharge: 2020-02-23 | Disposition: A | Discharge status: Home / Self Care | Payer: Managed Care, Other (non HMO) | Source: Ambulatory Visit | Attending: Obstetrics and Gynecology | Admitting: Obstetrics and Gynecology

## 2020-02-23 ENCOUNTER — Other Ambulatory Visit: Payer: Self-pay

## 2020-02-23 DIAGNOSIS — R928 Other abnormal and inconclusive findings on diagnostic imaging of breast: Secondary | ICD-10-CM

## 2020-03-26 ENCOUNTER — Encounter (INDEPENDENT_AMBULATORY_CARE_PROVIDER_SITE_OTHER): Payer: Self-pay

## 2020-04-21 ENCOUNTER — Telehealth: Payer: Self-pay | Admitting: Obstetrics and Gynecology

## 2020-04-21 NOTE — Telephone Encounter (Signed)
Opened in error

## 2020-06-29 ENCOUNTER — Other Ambulatory Visit: Payer: Managed Care, Other (non HMO)

## 2020-06-29 DIAGNOSIS — Z20822 Contact with and (suspected) exposure to covid-19: Secondary | ICD-10-CM

## 2020-07-02 LAB — NOVEL CORONAVIRUS, NAA: SARS-CoV-2, NAA: NOT DETECTED

## 2020-12-03 ENCOUNTER — Telehealth: Payer: Self-pay | Admitting: Orthopaedic Surgery

## 2021-01-02 ENCOUNTER — Ambulatory Visit (INDEPENDENT_AMBULATORY_CARE_PROVIDER_SITE_OTHER): Payer: Managed Care, Other (non HMO) | Admitting: Obstetrics and Gynecology

## 2021-01-02 ENCOUNTER — Encounter: Payer: Self-pay | Admitting: Obstetrics and Gynecology

## 2021-01-02 ENCOUNTER — Other Ambulatory Visit: Payer: Self-pay

## 2021-01-02 VITALS — BP 144/82 | HR 86 | Ht 64.5 in | Wt 243.0 lb

## 2021-01-02 DIAGNOSIS — Z01419 Encounter for gynecological examination (general) (routine) without abnormal findings: Secondary | ICD-10-CM

## 2021-01-02 DIAGNOSIS — B372 Candidiasis of skin and nail: Secondary | ICD-10-CM | POA: Diagnosis not present

## 2021-01-02 MED ORDER — CLOTRIMAZOLE 1 % EX CREA: 1.0000 "application " | TOPICAL_CREAM | Freq: Two times a day (BID) | CUTANEOUS | 1 refills | Status: AC

## 2021-01-02 MED ORDER — FLUCONAZOLE 150 MG PO TABS: 150.0000 mg | ORAL_TABLET | Freq: Once | ORAL | 0 refills | Status: AC

## 2021-01-02 NOTE — Progress Notes (Addendum)
50 y.o. G41P2104 Married Caucasian female here for annual exam.    No vaginal bleeding.  No hot flashes.  FSH 27.4 and E2 12.2 01/01/20.  Has not done Covid vaccine.  She has antibodies on testing per patient.  PCP: Assunta Found MD  Patient's last menstrual period was 10/28/2018 (approximate).           Sexually active: No.  The current method of family planning is tubal ligation.    Exercising: No.  Occ. yoga Smoker:  no  Health Maintenance: Pap:  01-01-20 ASCUS:Neg HR HPV, 12-28-18 Neg:Neg HR HPV, 11/07/15 Neg:Neg HR HPV History of abnormal Pap:  no MMG: 02-08-20 Lt.Br.asymmetry;Rt.Br.neg. Diag.Lt.Br./Neg/screening 01/2021/BiRads1 Colonoscopy: NEVER .  Her PCP is helping her to schedule this.  BMD:   n/a  Result  n/a TDaP:  PCP Gardasil:   no HIV:  Never Hep C: Never Screening Labs:  PCP.   reports that she has never smoked. She has never used smokeless tobacco. She reports that she does not drink alcohol and does not use drugs.  Past Medical History:  Diagnosis Date   Asthma    Elevated liver function tests    Elevated WBC count    Heart murmur    functional--since childhood   Hyperlipidemia    OAB (overactive bladder)    Urinary incontinence    occ. with coughing, sneezing    Past Surgical History:  Procedure Laterality Date   CESAREAN SECTION     2002, 2011   CHOLECYSTECTOMY  2005    Current Outpatient Medications  Medication Sig Dispense Refill   cyclobenzaprine (FLEXERIL) 10 MG tablet Take 10 mg by mouth 3 (three) times daily as needed.     IBU 600 MG tablet TAKE ONE TABLET BY MOUTH EVERY 6 HOURS AS NEEDED. 100 tablet 0   loratadine (CLARITIN) 10 MG tablet Take 10 mg by mouth daily.     montelukast (SINGULAIR) 10 MG tablet Take 1 tablet by mouth daily.  10   QVAR REDIHALER 80 MCG/ACT inhaler 2 puffs daily.     No current facility-administered medications for this visit.    Family History  Problem Relation Age of Onset   COPD Father        Dec age 29    Hypertension Mother    Thyroid disease Mother    Stroke Paternal Aunt    Rheum arthritis Maternal Grandmother    Asthma Maternal Grandfather    Hypertension Maternal Grandfather     Review of Systems  All other systems reviewed and are negative.  Exam:   BP (!) 144/82   Pulse 86   Ht 5' 4.5" (1.638 m)   Wt 243 lb (110.2 kg)   LMP 10/28/2018 (Approximate)   SpO2 99%   BMI 41.07 kg/m     General appearance: alert, cooperative and appears stated age Head: normocephalic, without obvious abnormality, atraumatic Neck: no adenopathy, supple, symmetrical, trachea midline and thyroid normal to inspection and palpation Lungs: clear to auscultation bilaterally Breasts: normal appearance, no masses or tenderness, No nipple retraction or dimpling, No nipple discharge or bleeding, No axillary adenopathy Heart: regular rate and rhythm Abdomen: soft, non-tender; no masses, no organomegaly.  Pannus present.  Under pannus in  the midline is a red moist well demarcated plaque on her skin, 12 x 5 cm.   Extremities: extremities normal, atraumatic, no cyanosis or edema Skin: skin color, texture, turgor normal. No rashes or lesions Lymph nodes: cervical, supraclavicular, and axillary nodes normal. Neurologic: grossly normal  Pelvic: External genitalia:  no lesions              No abnormal inguinal nodes palpated.              Urethra:  normal appearing urethra with no masses, tenderness or lesions              Bartholins and Skenes: normal                 Vagina: normal appearing vagina with normal color and discharge, no lesions              Cervix: no lesions              Pap taken: no Bimanual Exam:  Uterus:  normal size, contour, position, consistency, mobility, non-tender              Adnexa: no mass, fullness, tenderness              Rectal exam: yes.  Confirms.              Anus:  normal sphincter tone, no lesions  Chaperone was present for exam:  yes.  Assessment:   Well woman  visit with gynecologic exam Candida of flexural fold.  Plan: Mammogram screening discussed. Self breast awareness reviewed. Pap and HR HPV 2024.  Guidelines for Calcium, Vitamin D, regular exercise program including cardiovascular and weight bearing exercise. Diflucan 150 mg po x 1 and repeat in 72 hours.  Clotrimazole 1% cream, apply to area twice daily.  Try to keep the area dry. Labs with PCP. We discussed Covid vaccination.  Follow up annually and prn.   After visit summary provided.

## 2021-01-02 NOTE — Patient Instructions (Signed)

## 2021-02-24 ENCOUNTER — Telehealth: Payer: Self-pay | Admitting: Orthopaedic Surgery

## 2021-07-08 ENCOUNTER — Telehealth: Payer: Self-pay | Admitting: Orthopaedic Surgery

## 2022-01-05 NOTE — Progress Notes (Unsigned)
51 y.o. G79P2104 Married Caucasian female here for annual exam.    PCP: Assunta Found, MD    Patient's last menstrual period was 10/28/2018 (approximate).           Sexually active: {yes no:314532}  The current method of family planning is tubal ligation.    Exercising: {yes no:314532}  {types:19826} Smoker:  no  Health Maintenance: Pap:  01-01-20 ASCUS:Neg HR HPV, 12-28-18 Neg:Neg HR HPV, 11/07/15 Neg:Neg HR HPV History of abnormal Pap:  no MMG:  ***01-19-20 Lt.Br.aymmetry;Rt.br.neg. Diag.Lt.Br/Neg/Birads1 Colonoscopy:  ***NEVER--PCP TO SCHEDULE BMD:   N/A  Result  N/A TDaP:  pcp Gardasil:   no HIV: no Hep C: no Screening Labs:  Hb today: ***, Urine today: ***   reports that she has never smoked. She has never used smokeless tobacco. She reports that she does not drink alcohol and does not use drugs.  Past Medical History:  Diagnosis Date   Asthma    Elevated liver function tests    Elevated WBC count    Heart murmur    functional--since childhood   Hyperlipidemia    OAB (overactive bladder)    Urinary incontinence    occ. with coughing, sneezing    Past Surgical History:  Procedure Laterality Date   CESAREAN SECTION     2002, 2011   CHOLECYSTECTOMY  2005    Current Outpatient Medications  Medication Sig Dispense Refill   IBU 600 MG tablet TAKE ONE TABLET BY MOUTH EVERY 6 HOURS AS NEEDED. 100 tablet 0   clotrimazole (CLOTRIMAZOLE AF) 1 % cream Apply 1 application topically 2 (two) times daily. 30 g 1   cyclobenzaprine (FLEXERIL) 10 MG tablet Take 10 mg by mouth 3 (three) times daily as needed.     loratadine (CLARITIN) 10 MG tablet Take 10 mg by mouth daily.     montelukast (SINGULAIR) 10 MG tablet Take 1 tablet by mouth daily.  10   QVAR REDIHALER 80 MCG/ACT inhaler 2 puffs daily.     No current facility-administered medications for this visit.    Family History  Problem Relation Age of Onset   COPD Father        Dec age 32   Hypertension Mother    Thyroid  disease Mother    Stroke Paternal Aunt    Rheum arthritis Maternal Grandmother    Asthma Maternal Grandfather    Hypertension Maternal Grandfather     Review of Systems  Exam:   LMP 10/28/2018 (Approximate)     General appearance: alert, cooperative and appears stated age Head: normocephalic, without obvious abnormality, atraumatic Neck: no adenopathy, supple, symmetrical, trachea midline and thyroid normal to inspection and palpation Lungs: clear to auscultation bilaterally Breasts: normal appearance, no masses or tenderness, No nipple retraction or dimpling, No nipple discharge or bleeding, No axillary adenopathy Heart: regular rate and rhythm Abdomen: soft, non-tender; no masses, no organomegaly Extremities: extremities normal, atraumatic, no cyanosis or edema Skin: skin color, texture, turgor normal. No rashes or lesions Lymph nodes: cervical, supraclavicular, and axillary nodes normal. Neurologic: grossly normal  Pelvic: External genitalia:  no lesions              No abnormal inguinal nodes palpated.              Urethra:  normal appearing urethra with no masses, tenderness or lesions              Bartholins and Skenes: normal  Vagina: normal appearing vagina with normal color and discharge, no lesions              Cervix: no lesions              Pap taken: {yes no:314532} Bimanual Exam:  Uterus:  normal size, contour, position, consistency, mobility, non-tender              Adnexa: no mass, fullness, tenderness              Rectal exam: {yes no:314532}.  Confirms.              Anus:  normal sphincter tone, no lesions  Chaperone was present for exam:  ***  Assessment:   Well woman visit with gynecologic exam.   Plan: Mammogram screening discussed. Self breast awareness reviewed. Pap and HR HPV as above. Guidelines for Calcium, Vitamin D, regular exercise program including cardiovascular and weight bearing exercise.   Follow up annually and prn.    Additional counseling given.  {yes [id number]. _______ minutes face to face time of which over 50% was spent in counseling.    After visit summary provided.

## 2022-01-06 ENCOUNTER — Ambulatory Visit (INDEPENDENT_AMBULATORY_CARE_PROVIDER_SITE_OTHER): Payer: Managed Care, Other (non HMO) | Admitting: Obstetrics and Gynecology

## 2022-01-06 ENCOUNTER — Encounter: Payer: Self-pay | Admitting: Obstetrics and Gynecology

## 2022-01-06 ENCOUNTER — Other Ambulatory Visit (HOSPITAL_COMMUNITY)
Admission: RE | Admit: 2022-01-06 | Discharge: 2022-01-06 | Disposition: A | Discharge status: Home / Self Care | Payer: Managed Care, Other (non HMO) | Source: Ambulatory Visit | Attending: Obstetrics and Gynecology | Admitting: Obstetrics and Gynecology

## 2022-01-06 VITALS — BP 138/88 | HR 97 | Ht 64.5 in | Wt 243.0 lb

## 2022-01-06 DIAGNOSIS — Z8742 Personal history of other diseases of the female genital tract: Secondary | ICD-10-CM

## 2022-01-06 DIAGNOSIS — Z01419 Encounter for gynecological examination (general) (routine) without abnormal findings: Secondary | ICD-10-CM

## 2022-01-06 DIAGNOSIS — Z124 Encounter for screening for malignant neoplasm of cervix: Secondary | ICD-10-CM

## 2022-01-06 NOTE — Patient Instructions (Signed)

## 2022-01-12 LAB — CYTOLOGY - PAP
Comment: NEGATIVE
Diagnosis: NEGATIVE
Diagnosis: REACTIVE
High risk HPV: NEGATIVE

## 2022-01-16 ENCOUNTER — Telehealth: Payer: Self-pay | Admitting: Orthopaedic Surgery

## 2022-07-04 IMAGING — MG MM DIGITAL DIAGNOSTIC UNILAT*L* W/ TOMO W/ CAD
4 series · 4 of 12 positions shown · non-contrast
Comparison: Previous exam(s).

CLINICAL DATA: Patient was recall from screening mammogram for
possible asymmetry in the left breast.

EXAM:
DIGITAL DIAGNOSTIC UNILATERAL LEFT MAMMOGRAM WITH TOMO AND CAD

[L MLO synth-2D]
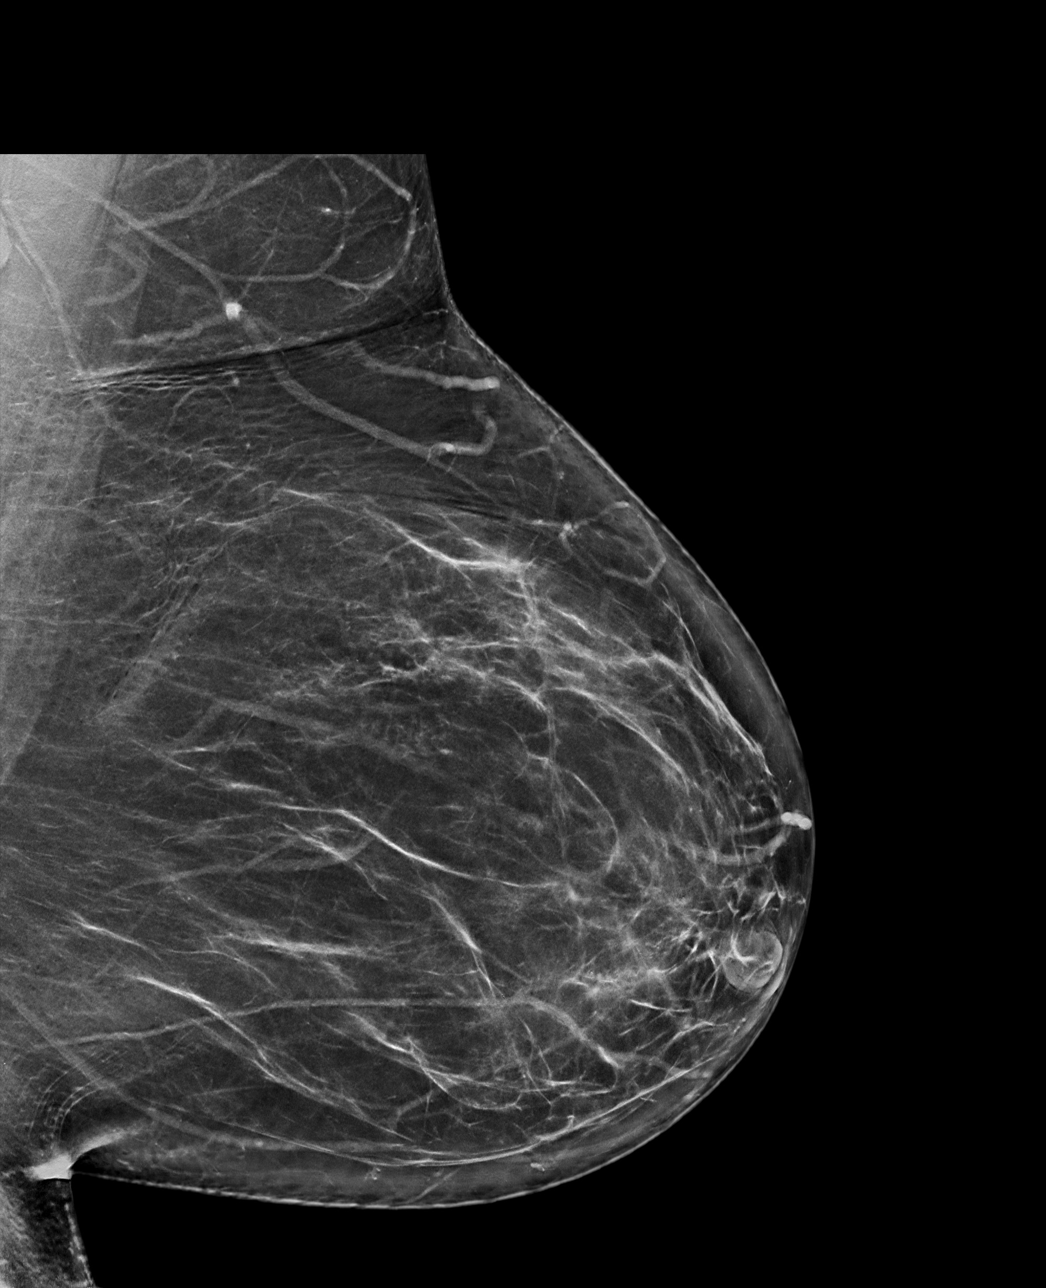

[L CC synth-2D]
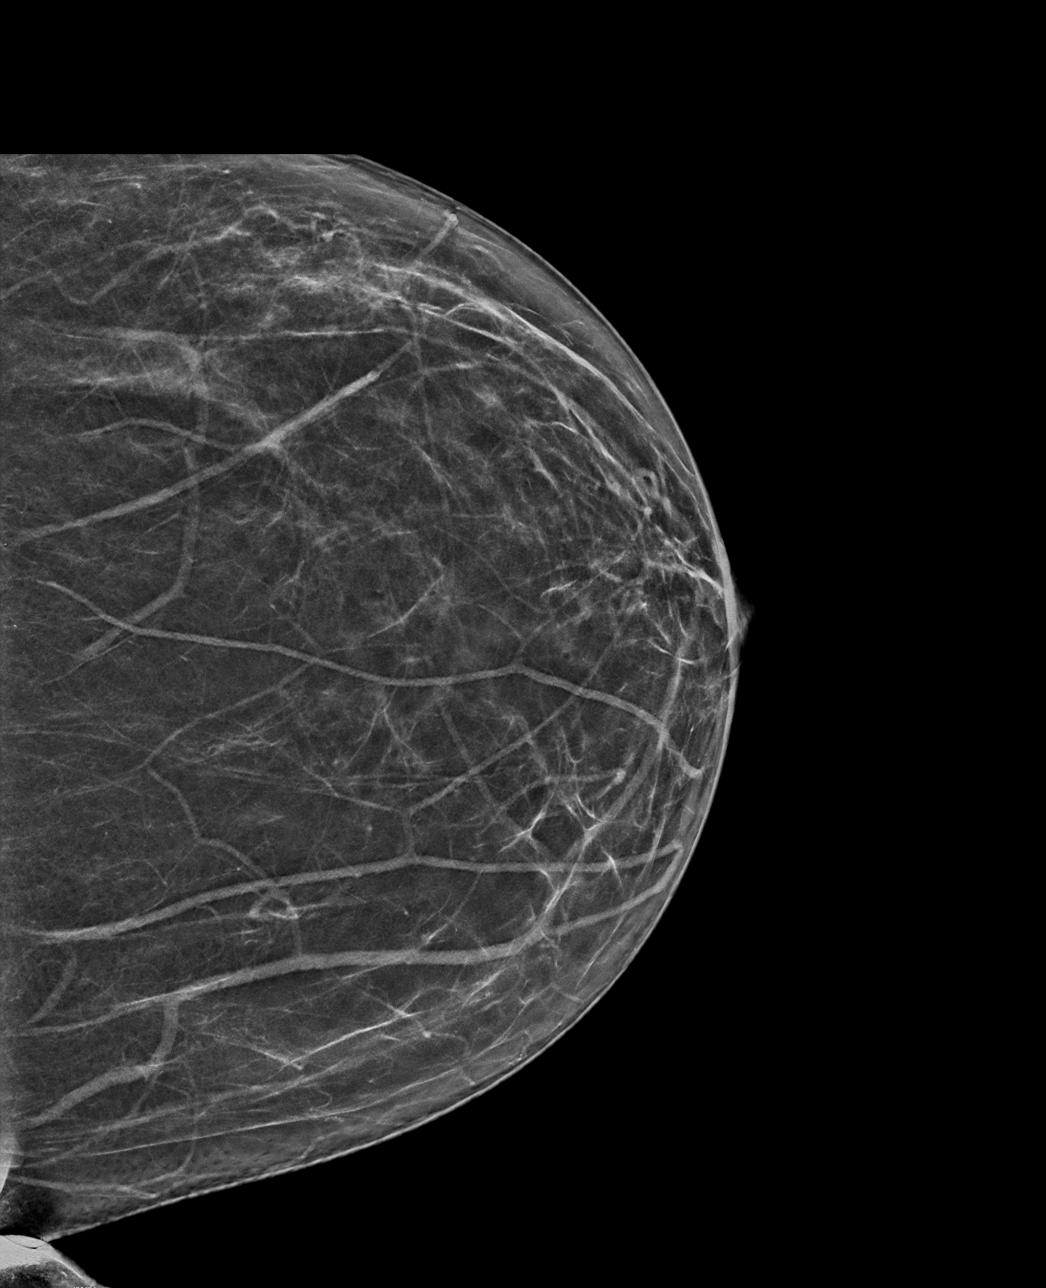

[L MLO tomo · tomo slice 41/82.0]
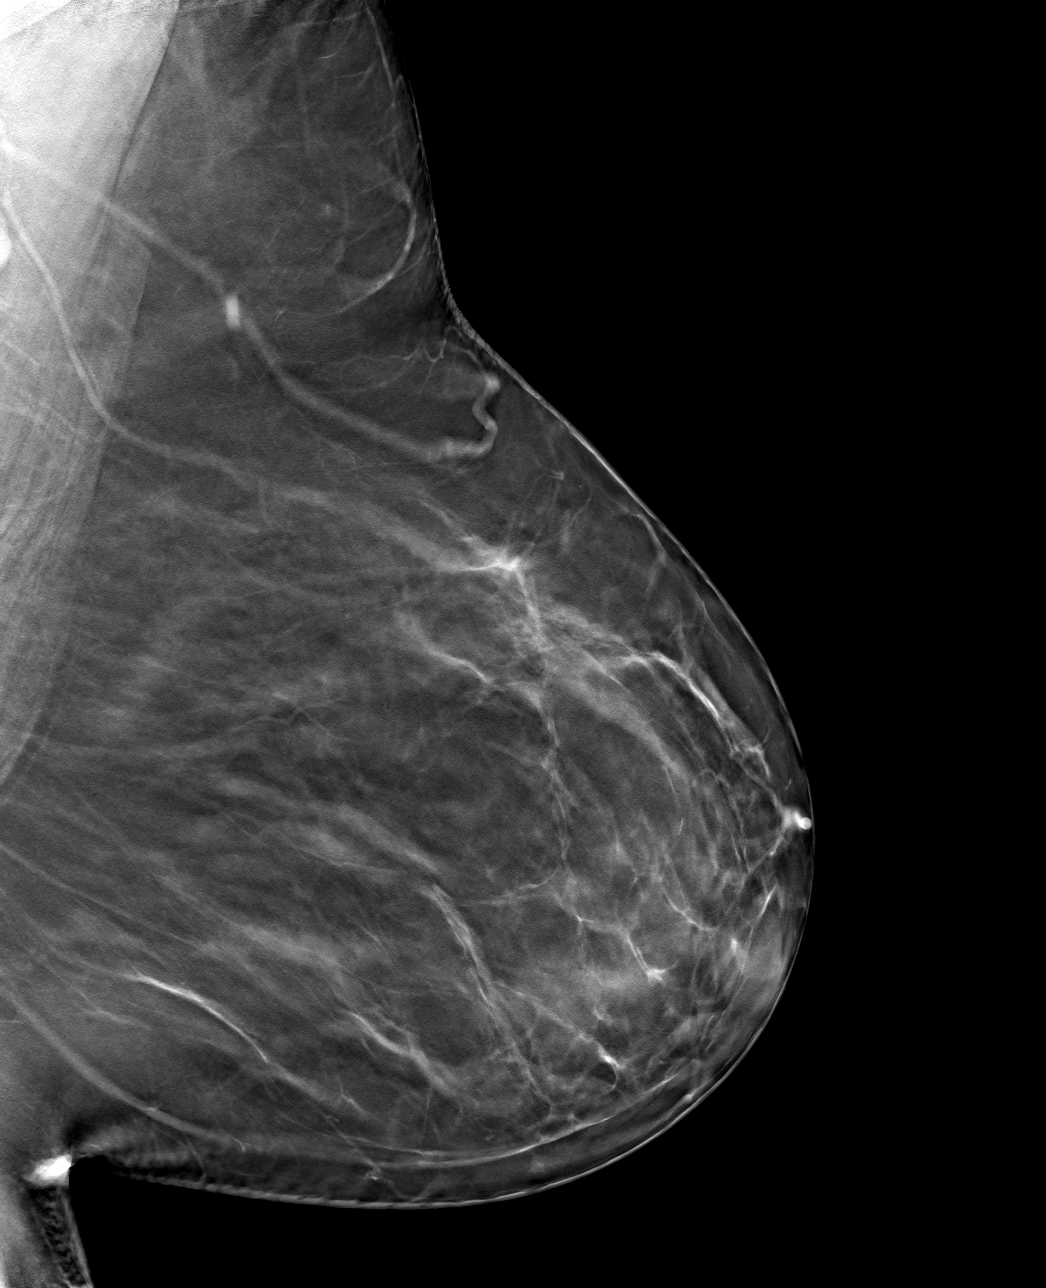

[L CC tomo · tomo slice 33/66.0]
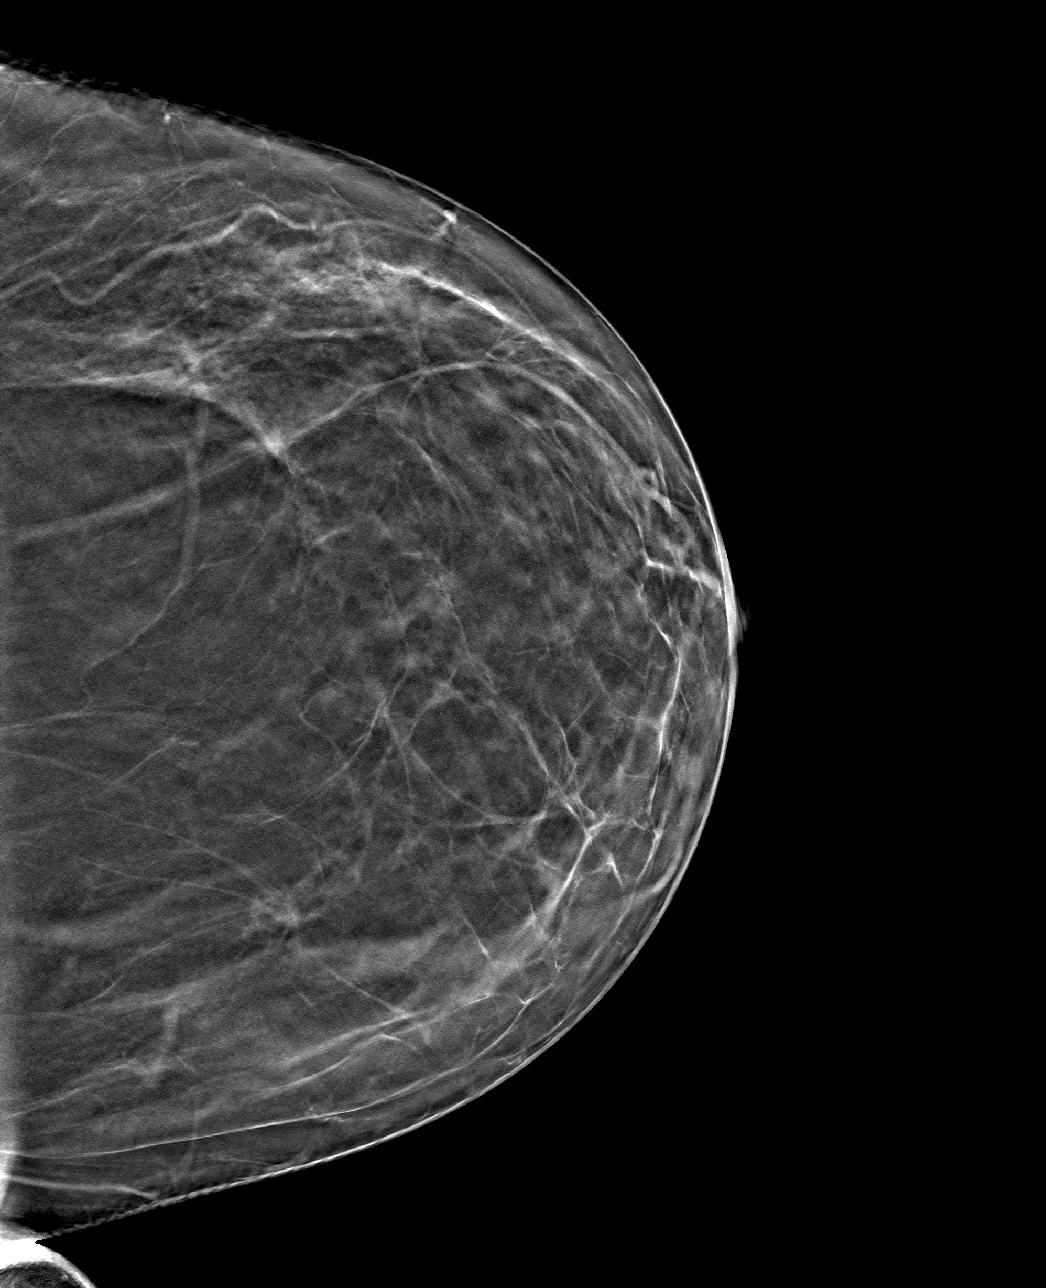

[4 of 12 positions shown; findings below may reference images not displayed]

ACR Breast Density Category b: There are scattered areas of
fibroglandular density.
FINDINGS: Additional imaging of the left breast was performed. No suspicious
mass, malignant type microcalcifications or distortion detected.

Mammographic images were processed with CAD.
IMPRESSION: No evidence of malignancy in the left breast.

RECOMMENDATION:
Bilateral screening mammogram in Wednesday January, 2021 is recommended.

I have discussed the findings and recommendations with the patient.
If applicable, a reminder letter will be sent to the patient
regarding the next appointment.

BI-RADS CATEGORY  1: Negative.

## 2023-01-11 ENCOUNTER — Ambulatory Visit: Payer: Managed Care, Other (non HMO) | Admitting: Obstetrics and Gynecology

## 2023-05-05 NOTE — Progress Notes (Unsigned)
52 y.o. G72P2104 Married Caucasian female here for annual exam.    PCP: Assunta Found, MD   Patient's last menstrual period was 10/28/2018 (approximate).           Sexually active: No.  The current method of family planning is tubal ligation.    Menopausal hormone therapy:  n/a Exercising: {yes no:314532}  {types:19826} Smoker:  no  OB History  Gravida Para Term Preterm AB Living  3 3 2 1  0 4  SAB IAB Ectopic Multiple Live Births  0 0 0 1 4    # Outcome Date GA Lbr Len/2nd Weight Sex Type Anes PTL Lv  3 Term         LIV  2A Preterm     M    LIV  2B Preterm     M    LIV  1 Term         LIV     HEALTH MAINTENANCE: Last 2 paps:  01/06/22 neg: HR HPV neg, 01/01/20 ASCUS: HR HPV neg History of abnormal Pap or positive HPV:  no Mammogram:   02/23/20 Breast Density Cat B, BI-RADS CAT 1 neg Colonoscopy:  n/a Bone Density:  n/a  Result  n/a    There is no immunization history on file for this patient.    reports that she has never smoked. She has never used smokeless tobacco. She reports that she does not drink alcohol and does not use drugs.  Past Medical History:  Diagnosis Date   Asthma    Elevated liver function tests    Elevated WBC count    Heart murmur    functional--since childhood   Hyperlipidemia    OAB (overactive bladder)    Urinary incontinence    occ. with coughing, sneezing    Past Surgical History:  Procedure Laterality Date   CESAREAN SECTION     2002, 2011   CHOLECYSTECTOMY  2005    Current Outpatient Medications  Medication Sig Dispense Refill   albuterol (VENTOLIN HFA) 108 (90 Base) MCG/ACT inhaler Inhale into the lungs every 6 (six) hours as needed for wheezing or shortness of breath.     clotrimazole (CLOTRIMAZOLE AF) 1 % cream Apply 1 application topically 2 (two) times daily. 30 g 1   cyclobenzaprine (FLEXERIL) 10 MG tablet Take 10 mg by mouth 3 (three) times daily as needed.     ibuprofen (IBU) 600 MG tablet TAKE ONE TABLET BY MOUTH EVERY  6 HOURS AS NEEDED. 100 tablet 5   loratadine (CLARITIN) 10 MG tablet Take 10 mg by mouth daily.     montelukast (SINGULAIR) 10 MG tablet Take 1 tablet by mouth daily.  10   QVAR REDIHALER 80 MCG/ACT inhaler 2 puffs daily.     No current facility-administered medications for this visit.    ALLERGIES: Penicillins  Family History  Problem Relation Age of Onset   COPD Father        Dec age 17   Hypertension Mother    Thyroid disease Mother    Stroke Paternal Aunt    Rheum arthritis Maternal Grandmother    Asthma Maternal Grandfather    Hypertension Maternal Grandfather     Review of Systems  PHYSICAL EXAM:  LMP 10/28/2018 (Approximate)     General appearance: alert, cooperative and appears stated age Head: normocephalic, without obvious abnormality, atraumatic Neck: no adenopathy, supple, symmetrical, trachea midline and thyroid normal to inspection and palpation Lungs: clear to auscultation bilaterally Breasts: normal appearance, no masses  or tenderness, No nipple retraction or dimpling, No nipple discharge or bleeding, No axillary adenopathy Heart: regular rate and rhythm Abdomen: soft, non-tender; no masses, no organomegaly Extremities: extremities normal, atraumatic, no cyanosis or edema Skin: skin color, texture, turgor normal. No rashes or lesions Lymph nodes: cervical, supraclavicular, and axillary nodes normal. Neurologic: grossly normal  Pelvic: External genitalia:  no lesions              No abnormal inguinal nodes palpated.              Urethra:  normal appearing urethra with no masses, tenderness or lesions              Bartholins and Skenes: normal                 Vagina: normal appearing vagina with normal color and discharge, no lesions              Cervix: no lesions              Pap taken: {yes no:314532} Bimanual Exam:  Uterus:  normal size, contour, position, consistency, mobility, non-tender              Adnexa: no mass, fullness, tenderness               Rectal exam: {yes no:314532}.  Confirms.              Anus:  normal sphincter tone, no lesions  Chaperone was present for exam:  {BSCHAPERONE:31226::"Jeshurun [place] F, CMA"}  ASSESSMENT: Well woman visit with gynecologic exam  ***  PLAN: Mammogram screening discussed. Self breast awareness reviewed. Pap and HRV collected:  {yes no:314532} Guidelines for Calcium, Vitamin D, regular exercise program including cardiovascular and weight bearing exercise. Medication refills:  *** {LABS (Optional):23779} Follow up:  ***    Additional counseling given.  {yes [id number]. ***  total time was spent for this patient encounter, including preparation, face-to-face counseling with the patient, coordination of care, and documentation of the encounter in addition to doing the well woman visit with gynecologic exam.

## 2023-05-19 ENCOUNTER — Ambulatory Visit: Payer: Managed Care, Other (non HMO) | Admitting: Obstetrics and Gynecology

## 2024-04-07 ENCOUNTER — Other Ambulatory Visit: Payer: Self-pay | Admitting: Obstetrics and Gynecology

## 2024-04-07 DIAGNOSIS — Z1231 Encounter for screening mammogram for malignant neoplasm of breast: Secondary | ICD-10-CM

## 2024-04-21 ENCOUNTER — Ambulatory Visit
Admission: RE | Admit: 2024-04-21 | Discharge: 2024-04-21 | Disposition: A | Discharge status: Home / Self Care | Source: Ambulatory Visit | Attending: Obstetrics and Gynecology | Admitting: Obstetrics and Gynecology

## 2024-04-21 DIAGNOSIS — Z1231 Encounter for screening mammogram for malignant neoplasm of breast: Secondary | ICD-10-CM

## 2024-04-25 ENCOUNTER — Ambulatory Visit: Payer: Self-pay | Admitting: Obstetrics and Gynecology

## 2024-04-27 ENCOUNTER — Encounter (INDEPENDENT_AMBULATORY_CARE_PROVIDER_SITE_OTHER): Payer: Self-pay | Admitting: *Deleted
